# Patient Record
Sex: Female | Born: 1960
Health system: Southern US, Community
[De-identification: ages and names within clinical notes are randomized; demographics above are authoritative.]

## PROBLEM LIST (undated history)

## (undated) DIAGNOSIS — T7840XA Allergy, unspecified, initial encounter: Secondary | ICD-10-CM

## (undated) DIAGNOSIS — I10 Essential (primary) hypertension: Secondary | ICD-10-CM

## (undated) HISTORY — PX: BREAST BIOPSY: SHX20

## (undated) HISTORY — DX: Allergy, unspecified, initial encounter: T78.40XA

## (undated) HISTORY — PX: ENDOMETRIAL ABLATION: SHX621

## (undated) HISTORY — DX: Essential (primary) hypertension: I10

---

## 2005-04-05 ENCOUNTER — Encounter: Payer: Self-pay | Admitting: Obstetrics & Gynecology

## 2005-04-05 ENCOUNTER — Ambulatory Visit (HOSPITAL_COMMUNITY): Admission: RE | Admit: 2005-04-05 | Discharge: 2005-04-05 | Payer: Self-pay | Admitting: Obstetrics & Gynecology

## 2006-09-17 ENCOUNTER — Ambulatory Visit (HOSPITAL_COMMUNITY): Admission: RE | Admit: 2006-09-17 | Discharge: 2006-09-17 | Payer: Self-pay | Admitting: Obstetrics & Gynecology

## 2007-09-19 ENCOUNTER — Ambulatory Visit (HOSPITAL_COMMUNITY): Admission: RE | Admit: 2007-09-19 | Discharge: 2007-09-19 | Payer: Self-pay | Admitting: Obstetrics & Gynecology

## 2007-09-19 ENCOUNTER — Other Ambulatory Visit: Admission: RE | Admit: 2007-09-19 | Discharge: 2007-09-19 | Payer: Self-pay | Admitting: Obstetrics & Gynecology

## 2008-09-22 ENCOUNTER — Ambulatory Visit (HOSPITAL_COMMUNITY): Admission: RE | Admit: 2008-09-22 | Discharge: 2008-09-22 | Payer: Self-pay | Admitting: Obstetrics & Gynecology

## 2008-09-22 ENCOUNTER — Other Ambulatory Visit: Admission: RE | Admit: 2008-09-22 | Discharge: 2008-09-22 | Payer: Self-pay | Admitting: Obstetrics and Gynecology

## 2009-09-23 ENCOUNTER — Ambulatory Visit (HOSPITAL_COMMUNITY): Admission: RE | Admit: 2009-09-23 | Discharge: 2009-09-23 | Payer: Self-pay | Admitting: Obstetrics & Gynecology

## 2009-09-23 ENCOUNTER — Other Ambulatory Visit: Admission: RE | Admit: 2009-09-23 | Discharge: 2009-09-23 | Payer: Self-pay | Admitting: Obstetrics & Gynecology

## 2010-08-26 ENCOUNTER — Other Ambulatory Visit: Payer: Self-pay | Admitting: Obstetrics & Gynecology

## 2010-08-26 DIAGNOSIS — Z1239 Encounter for other screening for malignant neoplasm of breast: Secondary | ICD-10-CM

## 2010-09-27 ENCOUNTER — Other Ambulatory Visit (HOSPITAL_COMMUNITY)
Admission: RE | Admit: 2010-09-27 | Discharge: 2010-09-27 | Disposition: A | Discharge status: Home / Self Care | Payer: BC Managed Care – PPO | Source: Ambulatory Visit | Attending: Obstetrics & Gynecology | Admitting: Obstetrics & Gynecology

## 2010-09-27 ENCOUNTER — Ambulatory Visit (HOSPITAL_COMMUNITY)
Admission: RE | Admit: 2010-09-27 | Discharge: 2010-09-27 | Disposition: A | Discharge status: Home / Self Care | Payer: BC Managed Care – PPO | Source: Ambulatory Visit | Attending: Obstetrics & Gynecology | Admitting: Obstetrics & Gynecology

## 2010-09-27 ENCOUNTER — Other Ambulatory Visit: Payer: Self-pay | Admitting: Obstetrics & Gynecology

## 2010-09-27 DIAGNOSIS — Z1239 Encounter for other screening for malignant neoplasm of breast: Secondary | ICD-10-CM

## 2010-09-27 DIAGNOSIS — Z01419 Encounter for gynecological examination (general) (routine) without abnormal findings: Secondary | ICD-10-CM | POA: Insufficient documentation

## 2010-09-27 DIAGNOSIS — Z1231 Encounter for screening mammogram for malignant neoplasm of breast: Secondary | ICD-10-CM | POA: Insufficient documentation

## 2010-12-23 NOTE — Op Note (Signed)
Rebecca Williams, Rebecca Williams                 ACCOUNT NO.:  [id number]   MEDICAL RECORD NO.:  [id number]          PATIENT TYPE:  AMB   LOCATION:  DAY                           FACILITY:  [facility]   PHYSICIAN:  Lazaro Arms, M.D.   DATE OF BIRTH:  23-Mar-1961   DATE OF PROCEDURE:  04/05/2005  DATE OF DISCHARGE:                                 OPERATIVE REPORT   PREOPERATIVE DIAGNOSES:  1.  Menometrorrhagia.  2.  Dysmenorrhea.   POSTOPERATIVE DIAGNOSES:  1.  Menometrorrhagia.  2.  Dysmenorrhea.   PROCEDURE:  Hysteroscopy, dilatation and curettage, endometrial ablation.   SURGEON:  Lazaro Arms, M.D.   ANESTHESIA:  General endotracheal.   FINDINGS:  The patient; had a normal endometrial cavity.  There were no  polyps or fibroids seen.  It sounded 1 cm.   DESCRIPTION OF PROCEDURE:  The patient was taken to the operating room and  placed in the supine position where she underwent general endotracheal  anesthesia.  She was placed in the dorsal lithotomy position and prepped and  draped in the usual sterile fashion.  A Graves speculum was placed.  Her  cervix was grasped.  A paracervical block was placed using 0.5% Marcaine  with 1:200,000 epinephrine.  The uterus sounded to 8 cm.  The cervix dilated  serially to allow passage with the hysteroscope.  A hysteroscopy was  performed and no abnormalities seen.  A vigorous uterine curettage was  performed and a good uterine cry was obtained in all areas.  We then  performed the Gynecare Thermal Choice-III endometrial ablation that required  16 mL of D-5-W to maintain a pressure between 190 and 200 mmHg.  It was  heated up to 87 degrees Celsius, for a total therapy time of eight minutes  and 45 seconds.  All the fluid was recovered at the end of the procedure.   The patient was awakened from anesthesia.  She tolerated the procedure well.  She had minimal blood loss.  She received Ancef and Toradol  prophylactically.      Lazaro Arms, M.D.  Electronically Signed    LHE/MEDQ  D:  04/05/2005  T:  04/05/2005  Job:  [id number]

## 2010-12-23 NOTE — H&P (Signed)
NAME:  Rebecca Williams, Rebecca Williams             ACCOUNT NO.:  [id number]   MEDICAL RECORD NO.:  [id number]          PATIENT TYPE:  AMB   LOCATION:                                FACILITY:  [facility]   PHYSICIAN:  Lazaro Arms, M.D.   DATE OF BIRTH:  [birth year]   DATE OF ADMISSION:  DATE OF DISCHARGE:  LH                                HISTORY & PHYSICAL   HISTORY OF PRESENT ILLNESS:  Rebecca Williams is a 50 year old white female, gravida  1, para 1, status post a tubal ligation in the past, who has been suffering  with prolonged, frequent, heavy menstrual periods for the last year.  She is  also having some left lower quadrant pain, but generally hurts just when she  is bleeding.  She has only had pain with intercourse one time.  That is not  generally a problem.  A vaginal probe ultrasound was performed in the office  and revealed a normal endometrial stripe.  She had a couple of fundal  fibroids, a little irregular on the uterine surface, but certainly nothing  distorting the stripe.  Both ovaries were normal, just small follicular  cysts.  On bimanual her uterus feels normal size.  It was nontender with no  cervical motion tenderness.  As a result we discussed options and Rebecca Williams  wants to proceed with a hysteroscopy/D&C and endometrial ablation.   PAST MEDICAL HISTORY:  Negative.   PAST SURGICAL HISTORY:  She had a C-section in 2000.   PAST OB HISTORY:  C-section in 2000.   ALLERGIES:  None.   MEDICATIONS:  Allegra D and Prozac.   PERSONAL HISTORY:  Significant for migraines.   SOCIAL HISTORY:  She drinks wine on occasion.  No tobacco.  No substance  abuse.  She is a Training and development officer.  Her husband has had a vasectomy.   REVIEW OF SYSTEMS:  As per HPI, but otherwise negative.   PHYSICAL EXAMINATION:  VITAL SIGNS:  Her weight is 224 pounds, blood  pressure 106/76.  HEENT:  Unremarkable.  NECK:  Thyroid is normal.  LUNGS:  Clear.  HEART:  Regular rate and rhythm without murmur, regurge, or gallop.  BREASTS:  Without masses, discharge, or skin changes.  ABDOMEN:  Benign.  No hepatosplenomegaly or masses.  GENITALIA:  She has normal external genitalia.  Vagina is pink and moist  without discharge.  Cervix is parous without lesions.  Uterus is normal  size, shape, and contour.  Adnexa is negative.  EXTREMITIES:  No edema.  NEUROLOGIC:  Exam is grossly intact.   IMPRESSION:  1.  Menometrorrhagia and dysmenorrhea.  2.  Left lower quadrant pain while bleeding.   PLAN:  The patient is admitted for hysteroscopy/D&C/endometrial ablation.  She understands risks, benefits, indications, alternatives and will proceed.      Lazaro Arms, M.D.  Electronically Signed     LHE/MEDQ  D:  04/04/2005  T:  04/04/2005  Job:  [id number]

## 2011-08-16 ENCOUNTER — Other Ambulatory Visit: Payer: Self-pay | Admitting: Obstetrics & Gynecology

## 2011-08-16 DIAGNOSIS — Z139 Encounter for screening, unspecified: Secondary | ICD-10-CM

## 2011-10-02 ENCOUNTER — Ambulatory Visit (HOSPITAL_COMMUNITY)
Admission: RE | Admit: 2011-10-02 | Discharge: 2011-10-02 | Disposition: A | Discharge status: Home / Self Care | Payer: BC Managed Care – PPO | Source: Ambulatory Visit | Attending: Obstetrics & Gynecology | Admitting: Obstetrics & Gynecology

## 2011-10-02 DIAGNOSIS — Z1231 Encounter for screening mammogram for malignant neoplasm of breast: Secondary | ICD-10-CM | POA: Insufficient documentation

## 2011-10-02 DIAGNOSIS — Z139 Encounter for screening, unspecified: Secondary | ICD-10-CM

## 2011-10-06 ENCOUNTER — Other Ambulatory Visit: Payer: Self-pay | Admitting: Obstetrics & Gynecology

## 2011-10-06 DIAGNOSIS — R928 Other abnormal and inconclusive findings on diagnostic imaging of breast: Secondary | ICD-10-CM

## 2011-10-25 ENCOUNTER — Ambulatory Visit (HOSPITAL_COMMUNITY)
Admission: RE | Admit: 2011-10-25 | Discharge: 2011-10-25 | Disposition: A | Discharge status: Home / Self Care | Payer: BC Managed Care – PPO | Source: Ambulatory Visit | Attending: Obstetrics & Gynecology | Admitting: Obstetrics & Gynecology

## 2011-10-25 DIAGNOSIS — R928 Other abnormal and inconclusive findings on diagnostic imaging of breast: Secondary | ICD-10-CM | POA: Insufficient documentation

## 2011-10-27 ENCOUNTER — Other Ambulatory Visit (HOSPITAL_COMMUNITY)
Admission: RE | Admit: 2011-10-27 | Discharge: 2011-10-27 | Disposition: A | Discharge status: Home / Self Care | Payer: BC Managed Care – PPO | Source: Ambulatory Visit | Attending: Obstetrics & Gynecology | Admitting: Obstetrics & Gynecology

## 2011-10-27 ENCOUNTER — Other Ambulatory Visit: Payer: Self-pay | Admitting: Obstetrics & Gynecology

## 2011-10-27 DIAGNOSIS — Z01419 Encounter for gynecological examination (general) (routine) without abnormal findings: Secondary | ICD-10-CM | POA: Insufficient documentation

## 2012-09-17 ENCOUNTER — Other Ambulatory Visit (HOSPITAL_COMMUNITY): Payer: Self-pay | Admitting: Obstetrics & Gynecology

## 2012-09-17 DIAGNOSIS — Z139 Encounter for screening, unspecified: Secondary | ICD-10-CM

## 2012-10-28 ENCOUNTER — Encounter: Payer: Self-pay | Admitting: Obstetrics & Gynecology

## 2012-10-28 ENCOUNTER — Ambulatory Visit (HOSPITAL_COMMUNITY)
Admission: RE | Admit: 2012-10-28 | Discharge: 2012-10-28 | Disposition: A | Discharge status: Home / Self Care | Payer: BC Managed Care – PPO | Source: Ambulatory Visit | Attending: Obstetrics & Gynecology | Admitting: Obstetrics & Gynecology

## 2012-10-28 ENCOUNTER — Ambulatory Visit (INDEPENDENT_AMBULATORY_CARE_PROVIDER_SITE_OTHER): Payer: BC Managed Care – PPO | Admitting: Obstetrics & Gynecology

## 2012-10-28 VITALS — BP 148/92 | Ht 67.0 in | Wt 205.0 lb

## 2012-10-28 DIAGNOSIS — Z139 Encounter for screening, unspecified: Secondary | ICD-10-CM

## 2012-10-28 DIAGNOSIS — Z0000 Encounter for general adult medical examination without abnormal findings: Secondary | ICD-10-CM

## 2012-10-28 DIAGNOSIS — Z1231 Encounter for screening mammogram for malignant neoplasm of breast: Secondary | ICD-10-CM | POA: Insufficient documentation

## 2012-10-28 DIAGNOSIS — Z01419 Encounter for gynecological examination (general) (routine) without abnormal findings: Secondary | ICD-10-CM

## 2012-10-28 MED ORDER — CONJ ESTROG-MEDROXYPROGEST ACE 0.625-2.5 MG PO TABS: 1.0000 | ORAL_TABLET | Freq: Every day | ORAL | Status: DC

## 2012-10-28 NOTE — Patient Instructions (Signed)

## 2012-10-28 NOTE — Progress Notes (Signed)
Patient ID: Rebecca Williams, female   DOB: Oct 04, 1960, 52 y.o.   MRN: [id number] Routine Gyn Exam Patient here for routine exam. Current complaints: None. Personal health questionnaire reviewed: yes   Gynecologic History No LMP recorded. Patient is postmenopausal. Contraception: post menopausal status Last Pap: 3.2013. Results were: normal Last mammogram: 2013. Results were: normal  Obstetric History OB History   Grav Para Term Preterm Abortions TAB SAB Ect Mult Living   1 1             # Outc Date GA Lbr Len/2nd Wgt Sex Del Anes PTL Lv   1 PAR               Subjective:     Obstetric History OB History   Grav Para Term Preterm Abortions TAB SAB Ect Mult Living   1 1             # Outc Date GA Lbr Len/2nd Wgt Sex Del Anes PTL Lv   1 PAR              Current outpatient prescriptions:estrogen, conjugated,-medroxyprogesterone (PREMPRO) 0.625-2.5 MG per tablet, Take 1 tablet by mouth daily., Disp: , Rfl: ;  fexofenadine-pseudoephedrine (ALLEGRA-D) 60-120 MG per tablet, Take 1 tablet by mouth 2 (two) times daily., Disp: , Rfl: ;  hydrochlorothiazide (MICROZIDE) 12.5 MG capsule, Take 12.5 mg by mouth daily., Disp: , Rfl:  estrogen, conjugated,-medroxyprogesterone (PREMPRO) 0.625-2.5 MG per tablet, Take 1 tablet by mouth daily., Disp: 30 tablet, Rfl: 11   The following portions of the patient's history were reviewed and updated as appropriate: allergies, current medications, past family history, past medical history, past social history, past surgical history and problem list.  Review of Systems  Review of Systems  Constitutional: Negative for fever, chills, weight loss, malaise/fatigue and diaphoresis.  HENT: Negative for hearing loss, ear pain, nosebleeds, congestion, sore throat, neck pain, tinnitus and ear discharge.   Eyes: Negative for blurred vision, double vision, photophobia, pain, discharge and redness.  Respiratory: Negative for cough, hemoptysis, sputum production,  shortness of breath, wheezing and stridor.   Cardiovascular: Negative for chest pain, palpitations, orthopnea, claudication, leg swelling and PND.  Gastrointestinal: Negative for abdominal pain. Negative for heartburn, nausea, vomiting, diarrhea, constipation, blood in stool and melena.  Genitourinary: Negative for dysuria, urgency, frequency, hematuria and flank pain.  Musculoskeletal: Negative for myalgias, back pain, joint pain and falls.  Skin: Negative for itching and rash.  Neurological: Negative for dizziness, tingling, tremors, sensory change, speech change, focal weakness, seizures, loss of consciousness, weakness and headaches.  Endo/Heme/Allergies: Negative for environmental allergies and polydipsia. Does not bruise/bleed easily.  Psychiatric/Behavioral: Negative for depression, suicidal ideas, hallucinations, memory loss and substance abuse. The patient is not nervous/anxious and does not have insomnia.         Objective:    BP 148/92  Ht 5\' 7"  (1.702 m)  Wt 205 lb (92.987 kg)  BMI 32.1 kg/m2  General Appearance:    Alert, cooperative, no distress, appears stated age  Head:    Normocephalic, without obvious abnormality, atraumatic  Eyes:    PERRL, conjunctiva/corneas clear, EOM's intact, fundi    benign, both eyes  Ears:    Normal TM's and external ear canals, both ears  Nose:   Nares normal, septum midline, mucosa normal, no drainage    or sinus tenderness  Throat:   Lips, mucosa, and tongue normal; teeth and gums normal  Neck:   Supple, symmetrical, trachea midline, no adenopathy;  thyroid:  no enlargement/tenderness/nodules; no carotid   bruit or JVD  Back:     Symmetric, no curvature, ROM normal, no CVA tenderness  Lungs:     Clear to auscultation bilaterally, respirations unlabored  Chest Wall:    No tenderness or deformity   Heart:    Regular rate and rhythm, S1 and S2 normal, no murmur, rub   or gallop  Breast Exam:    No tenderness, masses, or nipple abnormality   Abdomen:     Soft, non-tender, bowel sounds active all four quadrants,    no masses, no organomegaly  Genitalia:    Normal female without lesion, discharge or tenderness  Rectal:    Normal tone, normal prostate, no masses or tenderness;   guaiac negative stool  Extremities:   Extremities normal, atraumatic, no cyanosis or edema  Pulses:   2+ and symmetric all extremities  Skin:   Skin color, texture, turgor normal, no rashes or lesions  Lymph nodes:   Cervical, supraclavicular, and axillary nodes normal  Neurologic:   CNII-XII intact, normal strength, sensation and reflexes    throughout     Pap obtained Assessment:    Healthy female exam.  Menopausal   Plan:    Hormone replacement therapy: hormone replacement therapy: Continue PremPro + Elestrin 2 pumps for menopausal symptoms. Mammogram ordered. Follow up in: 1 year.   Lequisha Cammack H 10/28/2012 11:50 AM

## 2012-12-31 ENCOUNTER — Other Ambulatory Visit: Payer: Self-pay | Admitting: Obstetrics & Gynecology

## 2013-09-05 ENCOUNTER — Other Ambulatory Visit: Payer: Self-pay | Admitting: Obstetrics & Gynecology

## 2013-09-05 DIAGNOSIS — Z139 Encounter for screening, unspecified: Secondary | ICD-10-CM

## 2013-10-30 ENCOUNTER — Encounter: Payer: Self-pay | Admitting: Obstetrics & Gynecology

## 2013-10-30 ENCOUNTER — Encounter (INDEPENDENT_AMBULATORY_CARE_PROVIDER_SITE_OTHER): Payer: Self-pay

## 2013-10-30 ENCOUNTER — Ambulatory Visit (INDEPENDENT_AMBULATORY_CARE_PROVIDER_SITE_OTHER): Payer: BC Managed Care – PPO | Admitting: Obstetrics & Gynecology

## 2013-10-30 ENCOUNTER — Other Ambulatory Visit (HOSPITAL_COMMUNITY)
Admission: RE | Admit: 2013-10-30 | Discharge: 2013-10-30 | Disposition: A | Discharge status: Home / Self Care | Payer: BC Managed Care – PPO | Source: Ambulatory Visit | Attending: Obstetrics & Gynecology | Admitting: Obstetrics & Gynecology

## 2013-10-30 ENCOUNTER — Ambulatory Visit (HOSPITAL_COMMUNITY)
Admission: RE | Admit: 2013-10-30 | Discharge: 2013-10-30 | Disposition: A | Discharge status: Home / Self Care | Payer: BC Managed Care – PPO | Source: Ambulatory Visit | Attending: Obstetrics & Gynecology | Admitting: Obstetrics & Gynecology

## 2013-10-30 VITALS — BP 120/80 | Ht 67.4 in | Wt 202.0 lb

## 2013-10-30 DIAGNOSIS — Z1151 Encounter for screening for human papillomavirus (HPV): Secondary | ICD-10-CM | POA: Insufficient documentation

## 2013-10-30 DIAGNOSIS — Z139 Encounter for screening, unspecified: Secondary | ICD-10-CM

## 2013-10-30 DIAGNOSIS — Z1231 Encounter for screening mammogram for malignant neoplasm of breast: Secondary | ICD-10-CM | POA: Insufficient documentation

## 2013-10-30 DIAGNOSIS — Z1212 Encounter for screening for malignant neoplasm of rectum: Secondary | ICD-10-CM

## 2013-10-30 DIAGNOSIS — Z124 Encounter for screening for malignant neoplasm of cervix: Secondary | ICD-10-CM | POA: Insufficient documentation

## 2013-10-30 DIAGNOSIS — Z01419 Encounter for gynecological examination (general) (routine) without abnormal findings: Secondary | ICD-10-CM

## 2013-10-30 MED ORDER — ESTRADIOL 0.52 MG/0.87 GM (0.06%) TD GEL: 1.0000 "application " | Freq: Every day | TRANSDERMAL | Status: DC

## 2013-10-30 MED ORDER — CONJ ESTROGENS-BAZEDOXIFENE 0.45-20 MG PO TABS: 1.0000 | ORAL_TABLET | Freq: Every day | ORAL | Status: DC

## 2013-10-30 NOTE — Progress Notes (Signed)
Patient ID: Rebecca Williams, female   DOB: [birth year], 53 y.o.   MRN: [id number] Subjective:     Rebecca Williams is a 53 y.o. female here for a routine exam.  No LMP recorded. Patient has had an ablation. G1P1 Birth Control Method:  Pm  Menstrual Calendar(currently): none  Current complaints: none.   Current acute medical issues:  none   Recent Gynecologic History No LMP recorded. Patient has had an ablation. Last Pap: 2014,  normal Last mammogram: 2014,  normal  Past Medical History  Diagnosis Date  . Allergy   . Hypertension     Past Surgical History  Procedure Laterality Date  . Endometrial ablation    . Cesarean section      OB History   Grav Para Term Preterm Abortions TAB SAB Ect Mult Living   1 1              History   Social History  . Marital Status: Married    Spouse Name: N/A    Number of Children: N/A  . Years of Education: N/A   Social History Main Topics  . Smoking status: Never Smoker   . Smokeless tobacco: None  . Alcohol Use: No  . Drug Use: No  . Sexual Activity: Yes    Birth Control/ Protection: Post-menopausal   Other Topics Concern  . None   Social History Narrative  . None    Family History  Problem Relation Age of Onset  . Diabetes Father   . Hypertension Father   . Hyperlipidemia Father   . Diabetes Mother   . Hypertension Mother   . Hyperlipidemia Mother      Review of Systems  Review of Systems  Constitutional: Negative for fever, chills, weight loss, malaise/fatigue and diaphoresis.  HENT: Negative for hearing loss, ear pain, nosebleeds, congestion, sore throat, neck pain, tinnitus and ear discharge.   Eyes: Negative for blurred vision, double vision, photophobia, pain, discharge and redness.  Respiratory: Negative for cough, hemoptysis, sputum production, shortness of breath, wheezing and stridor.   Cardiovascular: Negative for chest pain, palpitations, orthopnea, claudication, leg swelling and PND.   Gastrointestinal: negative for abdominal pain. Negative for heartburn, nausea, vomiting, diarrhea, constipation, blood in stool and melena.  Genitourinary: Negative for dysuria, urgency, frequency, hematuria and flank pain.  Musculoskeletal: Negative for myalgias, back pain, joint pain and falls.  Skin: Negative for itching and rash.  Neurological: Negative for dizziness, tingling, tremors, sensory change, speech change, focal weakness, seizures, loss of consciousness, weakness and headaches.  Endo/Heme/Allergies: Negative for environmental allergies and polydipsia. Does not bruise/bleed easily.  Psychiatric/Behavioral: Negative for depression, suicidal ideas, hallucinations, memory loss and substance abuse. The patient is not nervous/anxious and does not have insomnia.        Objective:    Physical Exam  Vitals reviewed. Constitutional: She is oriented to person, place, and time. She appears well-developed and well-nourished.  HENT:  Head: Normocephalic and atraumatic.        Right Ear: External ear normal.  Left Ear: External ear normal.  Nose: Nose normal.  Mouth/Throat: Oropharynx is clear and moist.  Eyes: Conjunctivae and EOM are normal. Pupils are equal, round, and reactive to light. Right eye exhibits no discharge. Left eye exhibits no discharge. No scleral icterus.  Neck: Normal range of motion. Neck supple. No tracheal deviation present. No thyromegaly present.  Cardiovascular: Normal rate, regular rhythm, normal heart sounds and intact distal pulses.  Exam reveals no gallop and  no friction rub.   No murmur heard. Respiratory: Effort normal and breath sounds normal. No respiratory distress. She has no wheezes. She has no rales. She exhibits no tenderness.  GI: Soft. Bowel sounds are normal. She exhibits no distension and no mass. There is no tenderness. There is no rebound and no guarding.  Genitourinary:  Breasts no masses skin changes or nipple changes bilaterally       Vulva is normal without lesions Vagina is pink moist without discharge Cervix normal in appearance and pap is done Uterus is normal size shape and contour Adnexa is negative with normal sized ovaries  Rectal    hemoccult negative, normal tone, no masses  Musculoskeletal: Normal range of motion. She exhibits no edema and no tenderness.  Neurological: She is alert and oriented to person, place, and time. She has normal reflexes. She displays normal reflexes. No cranial nerve deficit. She exhibits normal muscle tone. Coordination normal.  Skin: Skin is warm and dry. No rash noted. No erythema. No pallor.  Psychiatric: She has a normal mood and affect. Her behavior is normal. Judgment and thought content normal.       Assessment:    Healthy female exam.    Plan:    Hormone replacement therapy: hormone replacement therapy: Duavee. Follow up in: 1 year.

## 2013-11-10 ENCOUNTER — Other Ambulatory Visit: Payer: Self-pay | Admitting: Obstetrics & Gynecology

## 2013-11-10 DIAGNOSIS — R928 Other abnormal and inconclusive findings on diagnostic imaging of breast: Secondary | ICD-10-CM

## 2013-11-19 ENCOUNTER — Ambulatory Visit (HOSPITAL_COMMUNITY)
Admission: RE | Admit: 2013-11-19 | Discharge: 2013-11-19 | Disposition: A | Discharge status: Home / Self Care | Payer: BC Managed Care – PPO | Source: Ambulatory Visit | Attending: Obstetrics & Gynecology | Admitting: Obstetrics & Gynecology

## 2013-11-19 DIAGNOSIS — R928 Other abnormal and inconclusive findings on diagnostic imaging of breast: Secondary | ICD-10-CM | POA: Insufficient documentation

## 2014-06-08 ENCOUNTER — Encounter: Payer: Self-pay | Admitting: Obstetrics & Gynecology

## 2014-10-28 ENCOUNTER — Other Ambulatory Visit: Payer: Self-pay | Admitting: Obstetrics & Gynecology

## 2014-10-28 DIAGNOSIS — Z1231 Encounter for screening mammogram for malignant neoplasm of breast: Secondary | ICD-10-CM

## 2014-11-05 ENCOUNTER — Other Ambulatory Visit: Payer: Self-pay | Admitting: Obstetrics & Gynecology

## 2014-11-16 ENCOUNTER — Encounter: Payer: Self-pay | Admitting: Obstetrics & Gynecology

## 2014-11-16 ENCOUNTER — Ambulatory Visit (INDEPENDENT_AMBULATORY_CARE_PROVIDER_SITE_OTHER): Payer: BLUE CROSS/BLUE SHIELD | Admitting: Obstetrics & Gynecology

## 2014-11-16 ENCOUNTER — Other Ambulatory Visit: Payer: Self-pay | Admitting: Obstetrics & Gynecology

## 2014-11-16 ENCOUNTER — Other Ambulatory Visit (HOSPITAL_COMMUNITY)
Admission: RE | Admit: 2014-11-16 | Discharge: 2014-11-16 | Disposition: A | Discharge status: Home / Self Care | Payer: BLUE CROSS/BLUE SHIELD | Source: Ambulatory Visit | Attending: Obstetrics & Gynecology | Admitting: Obstetrics & Gynecology

## 2014-11-16 VITALS — BP 140/90 | HR 72 | Ht 68.0 in | Wt 195.0 lb

## 2014-11-16 DIAGNOSIS — E038 Other specified hypothyroidism: Secondary | ICD-10-CM

## 2014-11-16 DIAGNOSIS — Z01419 Encounter for gynecological examination (general) (routine) without abnormal findings: Secondary | ICD-10-CM

## 2014-11-16 DIAGNOSIS — R7989 Other specified abnormal findings of blood chemistry: Secondary | ICD-10-CM

## 2014-11-16 DIAGNOSIS — Z1211 Encounter for screening for malignant neoplasm of colon: Secondary | ICD-10-CM

## 2014-11-16 DIAGNOSIS — Z1212 Encounter for screening for malignant neoplasm of rectum: Secondary | ICD-10-CM

## 2014-11-16 DIAGNOSIS — Z1389 Encounter for screening for other disorder: Secondary | ICD-10-CM

## 2014-11-16 DIAGNOSIS — E7800 Pure hypercholesterolemia, unspecified: Secondary | ICD-10-CM

## 2014-11-16 DIAGNOSIS — R739 Hyperglycemia, unspecified: Secondary | ICD-10-CM

## 2014-11-16 MED ORDER — ESTRADIOL 0.52 MG/0.87 GM (0.06%) TD GEL: TRANSDERMAL | Status: DC

## 2014-11-16 MED ORDER — CONJ ESTROGENS-BAZEDOXIFENE 0.45-20 MG PO TABS: 1.0000 | ORAL_TABLET | Freq: Every day | ORAL | Status: DC

## 2014-11-16 NOTE — Progress Notes (Signed)
Patient ID: Rebecca Williams, female   DOB: 09-11-1960, 54 y.o.   MRN: [id number] Subjective:     Rebecca Williams is a 54 y.o. female here for a routine exam.  No LMP recorded. Patient is postmenopausal. G1P1 Birth Control Method:  Post menopausal Menstrual Calendar(currently): no bleeding  Current complaints: none.   Current acute medical issues:  none   Recent Gynecologic History No LMP recorded. Patient is postmenopausal. Last Pap: 2015,  normal Last mammogram: 2015,  normal  Past Medical History  Diagnosis Date  . Allergy   . Hypertension     Past Surgical History  Procedure Laterality Date  . Endometrial ablation    . Cesarean section      OB History    Gravida Para Term Preterm AB TAB SAB Ectopic Multiple Living   1 1              History   Social History  . Marital Status: Married    Spouse Name: N/A  . Number of Children: N/A  . Years of Education: N/A   Social History Main Topics  . Smoking status: Never Smoker   . Smokeless tobacco: Not on file  . Alcohol Use: No  . Drug Use: No  . Sexual Activity: Yes    Birth Control/ Protection: Post-menopausal   Other Topics Concern  . None   Social History Narrative    Family History  Problem Relation Age of Onset  . Diabetes Father   . Hypertension Father   . Hyperlipidemia Father   . Diabetes Mother   . Hypertension Mother   . Hyperlipidemia Mother      Current outpatient prescriptions:  .  Ascorbic Acid (VITAMIN C) 100 MG tablet, Take 100 mg by mouth daily., Disp: , Rfl:  .  BLACK COHOSH PO, Take by mouth., Disp: , Rfl:  .  cyanocobalamin 100 MCG tablet, Take 100 mcg by mouth daily., Disp: , Rfl:  .  ELESTRIN 0.52 MG/0.87 GM (0.06%) GEL, USE 1 PUMP DAILY, INCREASE IF NEEDED, Disp: 52 g, Rfl: 11 .  Estradiol 0.52 MG/0.87 GM (0.06%) GEL, Apply 1 application topically daily., Disp: 1 Bottle, Rfl: 11 .  fexofenadine-pseudoephedrine (ALLEGRA-D) 60-120 MG per tablet, Take 1 tablet by mouth 2 (two)  times daily., Disp: , Rfl:  .  hydrochlorothiazide (MICROZIDE) 12.5 MG capsule, Take 12.5 mg by mouth daily., Disp: , Rfl:  .  zinc gluconate 50 MG tablet, Take 50 mg by mouth daily., Disp: , Rfl:  .  Conj Estrogens-Bazedoxifene 0.45-20 MG TABS, Take 1 tablet by mouth daily. Take 1 tablet daily (Patient not taking: Reported on 11/16/2014), Disp: 30 tablet, Rfl: 11  Review of Systems  Review of Systems  Constitutional: Negative for fever, chills, weight loss, malaise/fatigue and diaphoresis.  HENT: Negative for hearing loss, ear pain, nosebleeds, congestion, sore throat, neck pain, tinnitus and ear discharge.   Eyes: Negative for blurred vision, double vision, photophobia, pain, discharge and redness.  Respiratory: Negative for cough, hemoptysis, sputum production, shortness of breath, wheezing and stridor.   Cardiovascular: Negative for chest pain, palpitations, orthopnea, claudication, leg swelling and PND.  Gastrointestinal: negative for abdominal pain. Negative for heartburn, nausea, vomiting, diarrhea, constipation, blood in stool and melena.  Genitourinary: Negative for dysuria, urgency, frequency, hematuria and flank pain.  Musculoskeletal: Negative for myalgias, back pain, joint pain and falls.  Skin: Negative for itching and rash.  Neurological: Negative for dizziness, tingling, tremors, sensory change, speech change, focal weakness, seizures,  loss of consciousness, weakness and headaches.  Endo/Heme/Allergies: Negative for environmental allergies and polydipsia. Does not bruise/bleed easily.  Psychiatric/Behavioral: Negative for depression, suicidal ideas, hallucinations, memory loss and substance abuse. The patient is not nervous/anxious and does not have insomnia.        Objective:  Blood pressure 140/90, pulse 72, height 5\' 8"  (1.727 m), weight 195 lb (88.451 kg).   Physical Exam  Vitals reviewed. Constitutional: She is oriented to person, place, and time. She appears  well-developed and well-nourished.  HENT:  Head: Normocephalic and atraumatic.        Right Ear: External ear normal.  Left Ear: External ear normal.  Nose: Nose normal.  Mouth/Throat: Oropharynx is clear and moist.  Eyes: Conjunctivae and EOM are normal. Pupils are equal, round, and reactive to light. Right eye exhibits no discharge. Left eye exhibits no discharge. No scleral icterus.  Neck: Normal range of motion. Neck supple. No tracheal deviation present. No thyromegaly present.  Cardiovascular: Normal rate, regular rhythm, normal heart sounds and intact distal pulses.  Exam reveals no gallop and no friction rub.   No murmur heard. Respiratory: Effort normal and breath sounds normal. No respiratory distress. She has no wheezes. She has no rales. She exhibits no tenderness.  GI: Soft. Bowel sounds are normal. She exhibits no distension and no mass. There is no tenderness. There is no rebound and no guarding.  Genitourinary:  Breasts no masses skin changes or nipple changes bilaterally      Vulva is normal without lesions Vagina is pink moist without discharge Cervix normal in appearance and pap is done Uterus is normal size shape and contour Adnexa is negative with normal sized ovaries  {Rectal    hemoccult negative, normal tone, no masses  Musculoskeletal: Normal range of motion. She exhibits no edema and no tenderness.  Neurological: She is alert and oriented to person, place, and time. She has normal reflexes. She displays normal reflexes. No cranial nerve deficit. She exhibits normal muscle tone. Coordination normal.  Skin: Skin is warm and dry. No rash noted. No erythema. No pallor.  Psychiatric: She has a normal mood and affect. Her behavior is normal. Judgment and thought content normal.       Assessment:    Healthy female exam.    Plan:    Hormone replacement therapy: hormone replacement therapy: Elestrin + duavee. Mammogram ordered. Follow up in: 1 year.   TSH VIT  D Lipid panel Hgb A1C

## 2014-11-17 LAB — CYTOLOGY - PAP

## 2014-11-19 ENCOUNTER — Telehealth: Payer: Self-pay | Admitting: Obstetrics & Gynecology

## 2014-11-19 NOTE — Telephone Encounter (Signed)
Pt informed of lab results from 11/15/2013, Pap WNL, Cholesterol 218, Vit D pending. Pt verbalized understanding.

## 2014-11-22 LAB — VITAMIN D 1,25 DIHYDROXY
VITAMIN D3 1, 25 (OH): 43 pg/mL
Vitamin D 1, 25 (OH)2 Total: 44 pg/mL
Vitamin D2 1, 25 (OH)2: <10 pg/mL

## 2014-11-22 LAB — HEMOGLOBIN A1C
ESTIMATED AVERAGE GLUCOSE: 111 mg/dL
HEMOGLOBIN A1C: 5.5 % (ref 4.8–5.6)

## 2014-11-22 LAB — LIPID PANEL
CHOL/HDL RATIO: 2.1 ratio (ref 0.0–4.4)
Cholesterol, Total: 218 mg/dL — ABNORMAL HIGH (ref 100–199)
HDL: 103 mg/dL (ref >39)
LDL Calculated: 100 mg/dL — ABNORMAL HIGH (ref 0–99)
TRIGLYCERIDES: 74 mg/dL (ref 0–149)
VLDL Cholesterol Cal: 15 mg/dL (ref 5–40)

## 2014-11-22 LAB — TSH: TSH: 1.5 u[IU]/mL (ref 0.450–4.500)

## 2014-12-17 ENCOUNTER — Ambulatory Visit (HOSPITAL_COMMUNITY)
Admission: RE | Admit: 2014-12-17 | Discharge: 2014-12-17 | Disposition: A | Discharge status: Home / Self Care | Payer: BLUE CROSS/BLUE SHIELD | Source: Ambulatory Visit | Attending: Obstetrics & Gynecology | Admitting: Obstetrics & Gynecology

## 2014-12-17 DIAGNOSIS — Z1231 Encounter for screening mammogram for malignant neoplasm of breast: Secondary | ICD-10-CM | POA: Insufficient documentation

## 2015-11-15 ENCOUNTER — Other Ambulatory Visit: Payer: Self-pay | Admitting: Obstetrics & Gynecology

## 2015-11-15 DIAGNOSIS — Z1231 Encounter for screening mammogram for malignant neoplasm of breast: Secondary | ICD-10-CM

## 2015-11-23 ENCOUNTER — Other Ambulatory Visit: Payer: Self-pay | Admitting: Obstetrics & Gynecology

## 2015-12-21 ENCOUNTER — Other Ambulatory Visit: Payer: Self-pay | Admitting: Obstetrics & Gynecology

## 2015-12-21 ENCOUNTER — Other Ambulatory Visit: Payer: BLUE CROSS/BLUE SHIELD | Admitting: Obstetrics & Gynecology

## 2015-12-22 ENCOUNTER — Ambulatory Visit (HOSPITAL_COMMUNITY): Payer: BLUE CROSS/BLUE SHIELD

## 2015-12-23 ENCOUNTER — Encounter: Payer: Self-pay | Admitting: Obstetrics & Gynecology

## 2015-12-23 ENCOUNTER — Ambulatory Visit (INDEPENDENT_AMBULATORY_CARE_PROVIDER_SITE_OTHER): Payer: BLUE CROSS/BLUE SHIELD | Admitting: Obstetrics & Gynecology

## 2015-12-23 ENCOUNTER — Ambulatory Visit (HOSPITAL_COMMUNITY)
Admission: RE | Admit: 2015-12-23 | Discharge: 2015-12-23 | Disposition: A | Discharge status: Home / Self Care | Payer: BLUE CROSS/BLUE SHIELD | Source: Ambulatory Visit | Attending: Obstetrics & Gynecology | Admitting: Obstetrics & Gynecology

## 2015-12-23 ENCOUNTER — Other Ambulatory Visit (HOSPITAL_COMMUNITY)
Admission: RE | Admit: 2015-12-23 | Discharge: 2015-12-23 | Disposition: A | Discharge status: Home / Self Care | Payer: BLUE CROSS/BLUE SHIELD | Source: Ambulatory Visit | Attending: Obstetrics & Gynecology | Admitting: Obstetrics & Gynecology

## 2015-12-23 VITALS — BP 120/90 | HR 76 | Ht 68.0 in | Wt 180.0 lb

## 2015-12-23 DIAGNOSIS — Z1231 Encounter for screening mammogram for malignant neoplasm of breast: Secondary | ICD-10-CM | POA: Insufficient documentation

## 2015-12-23 DIAGNOSIS — Z1212 Encounter for screening for malignant neoplasm of rectum: Secondary | ICD-10-CM | POA: Diagnosis not present

## 2015-12-23 DIAGNOSIS — Z01419 Encounter for gynecological examination (general) (routine) without abnormal findings: Secondary | ICD-10-CM | POA: Diagnosis present

## 2015-12-23 DIAGNOSIS — Z1211 Encounter for screening for malignant neoplasm of colon: Secondary | ICD-10-CM

## 2015-12-23 NOTE — Progress Notes (Signed)
Patient ID: Rebecca Williams, female   DOB: January 17, 1961, 56 y.o.   MRN: [id number] Subjective:     Rebecca Williams is a 55 y.o. female here for a routine exam.  No LMP recorded. Patient has had an ablation. G1P1 Birth Control Method:  BTL Menstrual Calendar(currently): endometrial ablation 2006  Current complaints: none.   Current acute medical issues:  none   Recent Gynecologic History No LMP recorded. Patient has had an ablation. Last Pap: 2016,  normal Last mammogram: today,  pending  Past Medical History  Diagnosis Date  . Allergy   . Hypertension     Past Surgical History  Procedure Laterality Date  . Endometrial ablation    . Cesarean section      OB History    Gravida Para Term Preterm AB TAB SAB Ectopic Multiple Living   1 1              Social History   Social History  . Marital Status: Married    Spouse Name: N/A  . Number of Children: N/A  . Years of Education: N/A   Social History Main Topics  . Smoking status: Never Smoker   . Smokeless tobacco: None  . Alcohol Use: No  . Drug Use: No  . Sexual Activity: Yes    Birth Control/ Protection: Post-menopausal   Other Topics Concern  . None   Social History Narrative    Family History  Problem Relation Age of Onset  . Diabetes Father   . Hypertension Father   . Hyperlipidemia Father   . Diabetes Mother   . Hypertension Mother   . Hyperlipidemia Mother      Current outpatient prescriptions:  .  Ascorbic Acid (VITAMIN C) 100 MG tablet, Take 100 mg by mouth daily., Disp: , Rfl:  .  Biotin 5000 MCG TABS, Take by mouth., Disp: , Rfl:  .  BLACK COHOSH PO, Take by mouth., Disp: , Rfl:  .  cyanocobalamin 100 MCG tablet, Take 100 mcg by mouth daily., Disp: , Rfl:  .  DUAVEE 0.45-20 MG TABS, TAKE ONE TABLET BY MOUTH DAILY, Disp: 30 tablet, Rfl: 11 .  ELESTRIN 0.52 MG/0.87 GM (0.06%) GEL, USE 1 PUMP DAILY, INCREASE IF NEEDED, Disp: 52 g, Rfl: 11 .  fexofenadine-pseudoephedrine (ALLEGRA-D) 60-120 MG  per tablet, Take 1 tablet by mouth 2 (two) times daily., Disp: , Rfl:  .  hydrochlorothiazide (MICROZIDE) 12.5 MG capsule, Take 12.5 mg by mouth daily., Disp: , Rfl:  .  zinc gluconate 50 MG tablet, Take 50 mg by mouth daily., Disp: , Rfl:   Review of Systems  Review of Systems  Constitutional: Negative for fever, chills, weight loss, malaise/fatigue and diaphoresis.  HENT: Negative for hearing loss, ear pain, nosebleeds, congestion, sore throat, neck pain, tinnitus and ear discharge.   Eyes: Negative for blurred vision, double vision, photophobia, pain, discharge and redness.  Respiratory: Negative for cough, hemoptysis, sputum production, shortness of breath, wheezing and stridor.   Cardiovascular: Negative for chest pain, palpitations, orthopnea, claudication, leg swelling and PND.  Gastrointestinal: negative for abdominal pain. Negative for heartburn, nausea, vomiting, diarrhea, constipation, blood in stool and melena.  Genitourinary: Negative for dysuria, urgency, frequency, hematuria and flank pain.  Musculoskeletal: Negative for myalgias, back pain, joint pain and falls.  Skin: Negative for itching and rash.  Neurological: Negative for dizziness, tingling, tremors, sensory change, speech change, focal weakness, seizures, loss of consciousness, weakness and headaches.  Endo/Heme/Allergies: Negative for environmental allergies and  polydipsia. Does not bruise/bleed easily.  Psychiatric/Behavioral: Negative for depression, suicidal ideas, hallucinations, memory loss and substance abuse. The patient is not nervous/anxious and does not have insomnia.        Objective:  Blood pressure 120/90, pulse 76, height 5\' 8"  (1.727 m), weight 180 lb (81.647 kg).   Physical Exam  Vitals reviewed. Constitutional: She is oriented to person, place, and time. She appears well-developed and well-nourished.  HENT:  Head: Normocephalic and atraumatic.        Right Ear: External ear normal.  Left Ear:  External ear normal.  Nose: Nose normal.  Mouth/Throat: Oropharynx is clear and moist.  Eyes: Conjunctivae and EOM are normal. Pupils are equal, round, and reactive to light. Right eye exhibits no discharge. Left eye exhibits no discharge. No scleral icterus.  Neck: Normal range of motion. Neck supple. No tracheal deviation present. No thyromegaly present.  Cardiovascular: Normal rate, regular rhythm, normal heart sounds and intact distal pulses.  Exam reveals no gallop and no friction rub.   No murmur heard. Respiratory: Effort normal and breath sounds normal. No respiratory distress. She has no wheezes. She has no rales. She exhibits no tenderness.  GI: Soft. Bowel sounds are normal. She exhibits no distension and no mass. There is no tenderness. There is no rebound and no guarding.  Genitourinary:  Breasts no masses skin changes or nipple changes bilaterally      Vulva is normal without lesions Vagina is pink moist without discharge Cervix normal in appearance and pap is done Uterus is normal size shape and contour Adnexa is negative with normal sized ovaries  {Rectal    hemoccult negative, normal tone, no masses  Musculoskeletal: Normal range of motion. She exhibits no edema and no tenderness.  Neurological: She is alert and oriented to person, place, and time. She has normal reflexes. She displays normal reflexes. No cranial nerve deficit. She exhibits normal muscle tone. Coordination normal.  Skin: Skin is warm and dry. No rash noted. No erythema. No pallor.  Psychiatric: She has a normal mood and affect. Her behavior is normal. Judgment and thought content normal.       Medications Ordered at today's visit: Meds ordered this encounter  Medications  . Biotin 5000 MCG TABS    Sig: Take by mouth.    Other orders placed at today's visit: No orders of the defined types were placed in this encounter.      Assessment:    Healthy female exam.    Plan:    Mammogram  ordered. Follow up in: 1 year.     Return in about 1 year (around 12/22/2016) for yearly, with Dr Elonda Husky.

## 2015-12-27 ENCOUNTER — Other Ambulatory Visit: Payer: Self-pay | Admitting: Obstetrics & Gynecology

## 2015-12-27 DIAGNOSIS — R928 Other abnormal and inconclusive findings on diagnostic imaging of breast: Secondary | ICD-10-CM

## 2015-12-28 LAB — CYTOLOGY - PAP

## 2016-01-04 ENCOUNTER — Other Ambulatory Visit: Payer: Self-pay | Admitting: Obstetrics & Gynecology

## 2016-01-04 ENCOUNTER — Ambulatory Visit (HOSPITAL_COMMUNITY)
Admission: RE | Admit: 2016-01-04 | Discharge: 2016-01-04 | Disposition: A | Discharge status: Home / Self Care | Payer: BLUE CROSS/BLUE SHIELD | Source: Ambulatory Visit | Attending: Obstetrics & Gynecology | Admitting: Obstetrics & Gynecology

## 2016-01-04 DIAGNOSIS — R921 Mammographic calcification found on diagnostic imaging of breast: Secondary | ICD-10-CM | POA: Insufficient documentation

## 2016-01-04 DIAGNOSIS — R928 Other abnormal and inconclusive findings on diagnostic imaging of breast: Secondary | ICD-10-CM

## 2016-01-13 ENCOUNTER — Ambulatory Visit
Admission: RE | Admit: 2016-01-13 | Discharge: 2016-01-13 | Disposition: A | Discharge status: Home / Self Care | Payer: BLUE CROSS/BLUE SHIELD | Source: Ambulatory Visit | Attending: Obstetrics & Gynecology | Admitting: Obstetrics & Gynecology

## 2016-01-13 DIAGNOSIS — R921 Mammographic calcification found on diagnostic imaging of breast: Secondary | ICD-10-CM

## 2016-06-12 ENCOUNTER — Other Ambulatory Visit: Payer: Self-pay | Admitting: Obstetrics & Gynecology

## 2016-06-12 DIAGNOSIS — D242 Benign neoplasm of left breast: Secondary | ICD-10-CM

## 2016-07-21 ENCOUNTER — Ambulatory Visit
Admission: RE | Admit: 2016-07-21 | Discharge: 2016-07-21 | Disposition: A | Discharge status: Home / Self Care | Payer: BLUE CROSS/BLUE SHIELD | Source: Ambulatory Visit | Attending: Obstetrics & Gynecology | Admitting: Obstetrics & Gynecology

## 2016-07-21 DIAGNOSIS — D242 Benign neoplasm of left breast: Secondary | ICD-10-CM

## 2016-11-09 ENCOUNTER — Other Ambulatory Visit: Payer: Self-pay | Admitting: Obstetrics & Gynecology

## 2016-12-26 ENCOUNTER — Other Ambulatory Visit: Payer: BLUE CROSS/BLUE SHIELD | Admitting: Obstetrics & Gynecology

## 2017-02-12 ENCOUNTER — Other Ambulatory Visit: Payer: Self-pay | Admitting: Obstetrics & Gynecology

## 2017-02-12 DIAGNOSIS — Z1231 Encounter for screening mammogram for malignant neoplasm of breast: Secondary | ICD-10-CM

## 2017-02-19 ENCOUNTER — Other Ambulatory Visit: Payer: BLUE CROSS/BLUE SHIELD | Admitting: Obstetrics & Gynecology

## 2017-02-26 ENCOUNTER — Other Ambulatory Visit (HOSPITAL_COMMUNITY)
Admission: RE | Admit: 2017-02-26 | Discharge: 2017-02-26 | Disposition: A | Discharge status: Home / Self Care | Payer: BLUE CROSS/BLUE SHIELD | Source: Ambulatory Visit | Attending: Obstetrics & Gynecology | Admitting: Obstetrics & Gynecology

## 2017-02-26 ENCOUNTER — Encounter (INDEPENDENT_AMBULATORY_CARE_PROVIDER_SITE_OTHER): Payer: Self-pay

## 2017-02-26 ENCOUNTER — Ambulatory Visit (INDEPENDENT_AMBULATORY_CARE_PROVIDER_SITE_OTHER): Payer: BLUE CROSS/BLUE SHIELD | Admitting: Obstetrics & Gynecology

## 2017-02-26 ENCOUNTER — Encounter: Payer: Self-pay | Admitting: Obstetrics & Gynecology

## 2017-02-26 VITALS — BP 120/80 | HR 80 | Ht 68.0 in | Wt 192.0 lb

## 2017-02-26 DIAGNOSIS — Z01419 Encounter for gynecological examination (general) (routine) without abnormal findings: Secondary | ICD-10-CM | POA: Diagnosis present

## 2017-02-26 DIAGNOSIS — Z1212 Encounter for screening for malignant neoplasm of rectum: Secondary | ICD-10-CM

## 2017-02-26 DIAGNOSIS — Z1211 Encounter for screening for malignant neoplasm of colon: Secondary | ICD-10-CM

## 2017-02-26 MED ORDER — CONJ ESTROGENS-BAZEDOXIFENE 0.45-20 MG PO TABS: 1.0000 | ORAL_TABLET | Freq: Every day | ORAL | 11 refills | Status: DC

## 2017-02-26 MED ORDER — ESTRADIOL 0.52 MG/0.87 GM (0.06%) TD GEL: TRANSDERMAL | 11 refills | Status: DC

## 2017-02-26 NOTE — Progress Notes (Signed)
Subjective:     Rebecca Williams is a 56 y.o. female here for a routine exam.  No LMP recorded. Patient is postmenopausal. G1P1 Birth Control Method:  Post menopausal Menstrual Calendar(currently): amenorrheic  Current complaints: none, minimal hot flushes.   Current acute medical issues:  none   Recent Gynecologic History No LMP recorded. Patient is postmenopausal. Last Pap: 2017,  normal Last mammogram: 2018,  Interval required biospy, next scheduled 03/13/2017  Past Medical History:  Diagnosis Date  . Allergy   . Hypertension     Past Surgical History:  Procedure Laterality Date  . CESAREAN SECTION    . ENDOMETRIAL ABLATION      OB History    Gravida Para Term Preterm AB Living   1 1           SAB TAB Ectopic Multiple Live Births                  Social History   Social History  . Marital status: Married    Spouse name: N/A  . Number of children: N/A  . Years of education: N/A   Social History Main Topics  . Smoking status: Never Smoker  . Smokeless tobacco: Never Used  . Alcohol use No  . Drug use: No  . Sexual activity: Yes    Birth control/ protection: Post-menopausal   Other Topics Concern  . None   Social History Narrative  . None    Family History  Problem Relation Age of Onset  . Diabetes Father   . Hypertension Father   . Hyperlipidemia Father   . Diabetes Mother   . Hypertension Mother   . Hyperlipidemia Mother      Current Outpatient Prescriptions:  .  Ascorbic Acid (VITAMIN C) 100 MG tablet, Take 100 mg by mouth daily., Disp: , Rfl:  .  Biotin 5000 MCG TABS, Take by mouth., Disp: , Rfl:  .  BLACK COHOSH PO, Take by mouth., Disp: , Rfl:  .  Conj Estrogens-Bazedoxifene (DUAVEE) 0.45-20 MG TABS, Take 1 tablet by mouth daily., Disp: 30 tablet, Rfl: 11 .  cyanocobalamin 100 MCG tablet, Take 100 mcg by mouth daily., Disp: , Rfl:  .  Estradiol (ELESTRIN) 0.52 MG/0.87 GM (0.06%) GEL, USE 1 PUMP DAILY, INCREASE IF NEEDED, Disp: 52 g, Rfl:  11 .  fexofenadine-pseudoephedrine (ALLEGRA-D) 60-120 MG per tablet, Take 1 tablet by mouth 2 (two) times daily., Disp: , Rfl:  .  hydrochlorothiazide (MICROZIDE) 12.5 MG capsule, Take 12.5 mg by mouth daily., Disp: , Rfl:  .  zinc gluconate 50 MG tablet, Take 50 mg by mouth daily., Disp: , Rfl:   Review of Systems  Review of Systems  Constitutional: Negative for fever, chills, weight loss, malaise/fatigue and diaphoresis.  HENT: Negative for hearing loss, ear pain, nosebleeds, congestion, sore throat, neck pain, tinnitus and ear discharge.   Eyes: Negative for blurred vision, double vision, photophobia, pain, discharge and redness.  Respiratory: Negative for cough, hemoptysis, sputum production, shortness of breath, wheezing and stridor.   Cardiovascular: Negative for chest pain, palpitations, orthopnea, claudication, leg swelling and PND.  Gastrointestinal: negative for abdominal pain. Negative for heartburn, nausea, vomiting, diarrhea, constipation, blood in stool and melena.  Genitourinary: Negative for dysuria, urgency, frequency, hematuria and flank pain.  Musculoskeletal: Negative for myalgias, back pain, joint pain and falls.  Skin: Negative for itching and rash.  Neurological: Negative for dizziness, tingling, tremors, sensory change, speech change, focal weakness, seizures, loss of consciousness, weakness and  headaches.  Endo/Heme/Allergies: Negative for environmental allergies and polydipsia. Does not bruise/bleed easily.  Psychiatric/Behavioral: Negative for depression, suicidal ideas, hallucinations, memory loss and substance abuse. The patient is not nervous/anxious and does not have insomnia.        Objective:  Blood pressure 120/80, pulse 80, height 5\' 8"  (1.727 m), weight 192 lb (87.1 kg).   Physical Exam  Vitals reviewed. Constitutional: She is oriented to person, place, and time. She appears well-developed and well-nourished.  HENT:  Head: Normocephalic and atraumatic.         Right Ear: External ear normal.  Left Ear: External ear normal.  Nose: Nose normal.  Mouth/Throat: Oropharynx is clear and moist.  Eyes: Conjunctivae and EOM are normal. Pupils are equal, round, and reactive to light. Right eye exhibits no discharge. Left eye exhibits no discharge. No scleral icterus.  Neck: Normal range of motion. Neck supple. No tracheal deviation present. No thyromegaly present.  Cardiovascular: Normal rate, regular rhythm, normal heart sounds and intact distal pulses.  Exam reveals no gallop and no friction rub.   No murmur heard. Respiratory: Effort normal and breath sounds normal. No respiratory distress. She has no wheezes. She has no rales. She exhibits no tenderness.  GI: Soft. Bowel sounds are normal. She exhibits no distension and no mass. There is no tenderness. There is no rebound and no guarding.  Genitourinary:  Breasts no masses skin changes or nipple changes bilaterally      Vulva is normal without lesions Vagina is pink moist without discharge Cervix normal in appearance and pap is done Uterus is normal size shape and contour Adnexa is negative with normal sized ovaries  {Rectal    hemoccult negative, normal tone, no masses  Musculoskeletal: Normal range of motion. She exhibits no edema and no tenderness.  Neurological: She is alert and oriented to person, place, and time. She has normal reflexes. She displays normal reflexes. No cranial nerve deficit. She exhibits normal muscle tone. Coordination normal.  Skin: Skin is warm and dry. No rash noted. No erythema. No pallor.  Psychiatric: She has a normal mood and affect. Her behavior is normal. Judgment and thought content normal.       Medications Ordered at today's visit: Meds ordered this encounter  Medications  . Estradiol (ELESTRIN) 0.52 MG/0.87 GM (0.06%) GEL    Sig: USE 1 PUMP DAILY, INCREASE IF NEEDED    Dispense:  52 g    Refill:  11  . Conj Estrogens-Bazedoxifene (DUAVEE) 0.45-20 MG  TABS    Sig: Take 1 tablet by mouth daily.    Dispense:  30 tablet    Refill:  11    This prescription was filled on 11/09/2016. Any refills authorized will be placed on file.    Other orders placed at today's visit: No orders of the defined types were placed in this encounter.     Assessment:    Healthy female exam.   postmenopuasal doing well on HRT Plan:    Contraception: post menopausal status. Hormone replacement therapy: hormone replacement therapy: as abov. Mammogram ordered. Follow up in: 1 year.     Return in about 1 year (around 02/26/2018) for yearly, with Dr Elonda Husky.

## 2017-02-26 NOTE — Addendum Note (Signed)
Addended by: Diona Fanti A on: 02/26/2017 10:09 AM   Modules accepted: Orders

## 2017-03-02 LAB — CYTOLOGY - PAP
Diagnosis: NEGATIVE
HPV: NOT DETECTED

## 2017-03-12 ENCOUNTER — Ambulatory Visit: Payer: BLUE CROSS/BLUE SHIELD

## 2017-03-13 ENCOUNTER — Ambulatory Visit
Admission: RE | Admit: 2017-03-13 | Discharge: 2017-03-13 | Disposition: A | Discharge status: Home / Self Care | Payer: BLUE CROSS/BLUE SHIELD | Source: Ambulatory Visit | Attending: Obstetrics & Gynecology | Admitting: Obstetrics & Gynecology

## 2017-03-13 DIAGNOSIS — Z1231 Encounter for screening mammogram for malignant neoplasm of breast: Secondary | ICD-10-CM

## 2017-04-02 ENCOUNTER — Encounter: Payer: Self-pay | Admitting: Obstetrics & Gynecology

## 2017-04-02 ENCOUNTER — Ambulatory Visit (INDEPENDENT_AMBULATORY_CARE_PROVIDER_SITE_OTHER): Payer: BLUE CROSS/BLUE SHIELD | Admitting: Obstetrics & Gynecology

## 2017-04-02 VITALS — BP 110/70 | HR 65 | Ht 68.0 in | Wt 189.5 lb

## 2017-04-02 DIAGNOSIS — L299 Pruritus, unspecified: Secondary | ICD-10-CM | POA: Diagnosis not present

## 2017-04-02 MED ORDER — ZOLPIDEM TARTRATE 10 MG PO TABS: 10.0000 mg | ORAL_TABLET | Freq: Every evening | ORAL | 3 refills | Status: DC | PRN

## 2017-04-02 MED ORDER — RANITIDINE HCL 150 MG PO TABS: 150.0000 mg | ORAL_TABLET | Freq: Two times a day (BID) | ORAL | 12 refills | Status: DC

## 2017-04-02 MED ORDER — LEVOCETIRIZINE DIHYDROCHLORIDE 5 MG PO TABS: 5.0000 mg | ORAL_TABLET | Freq: Every evening | ORAL | 12 refills | Status: DC

## 2017-04-02 NOTE — Progress Notes (Signed)
Chief Complaint  Patient presents with  . feels fatigued    hot flashes; itches constantly all over; ? hormones or thyroid    Blood pressure 110/70, pulse 65, height 5\' 8"  (1.727 m), weight 189 lb 8 oz (86 kg).  56 y.o. G1P1001 No LMP recorded. Patient is postmenopausal. The current method of family planning is post menopausal status.  Outpatient Encounter Medications as of 04/02/2017  Medication Sig  . Ascorbic Acid (VITAMIN C) 100 MG tablet Take 100 mg by mouth daily.  . Biotin 5000 MCG TABS Take by mouth daily.   Marland Kitchen BLACK COHOSH PO Take by mouth 2 (two) times daily.   . Cholecalciferol (VITAMIN D PO) Take by mouth daily.  Marland Kitchen Conj Estrogens-Bazedoxifene (DUAVEE) 0.45-20 MG TABS Take 1 tablet by mouth daily.  . cyanocobalamin 100 MCG tablet Take 100 mcg by mouth daily.  . fexofenadine-pseudoephedrine (ALLEGRA-D) 60-120 MG per tablet Take 1 tablet by mouth as needed.   . hydrochlorothiazide (MICROZIDE) 12.5 MG capsule Take 12.5 mg by mouth daily.  Marland Kitchen zinc gluconate 50 MG tablet Take 50 mg by mouth daily.  . [DISCONTINUED] Estradiol (ELESTRIN) 0.52 MG/0.87 GM (0.06%) GEL USE 1 PUMP DAILY, INCREASE IF NEEDED (Patient taking differently: USE 2 PUMP DAILY, INCREASE IF NEEDED)  . levocetirizine (XYZAL ALLERGY 24HR) 5 MG tablet Take 1 tablet (5 mg total) by mouth every evening.  . ranitidine (ZANTAC) 150 MG tablet Take 1 tablet (150 mg total) by mouth 2 (two) times daily.  . [DISCONTINUED] zolpidem (AMBIEN) 10 MG tablet Take 1 tablet (10 mg total) by mouth at bedtime as needed for sleep.   No facility-administered encounter medications on file as of 04/02/2017.     Subjective Rebecca Williams comes in with a relatively sudden onset of severe itching anxiety and insomnia It appears to me that the itching came first which led to insomnia which is now caused anxiety I've never single and lives this she really appears to be in the right She really is going crazy with the itching is been going on for  about 48 hours and she is getting no relief She's taken oatmeal baths nothing is helping Benadryl is not even help  Objective Blood pressure 110/70, pulse 65, height 5\' 8"  (1.727 m), weight 189 lb 8 oz (86 kg).  General this is a well-developed well-nourished female who is in mild-to-moderate distress from itching  Pertinent ROS   Labs or studies     Impression Diagnoses this Encounter::   ICD-10-CM   1. Pruritus L29.9    I believe this is a mast cell mediated anti IgE issue, no rash    Established relevant diagnosis(es):   Plan/Recommendations: Meds ordered this encounter  Medications  . ranitidine (ZANTAC) 150 MG tablet    Sig: Take 1 tablet (150 mg total) by mouth 2 (two) times daily.    Dispense:  60 tablet    Refill:  12  . levocetirizine (XYZAL ALLERGY 24HR) 5 MG tablet    Sig: Take 1 tablet (5 mg total) by mouth every evening.    Dispense:  30 tablet    Refill:  12  . DISCONTD: zolpidem (AMBIEN) 10 MG tablet    Sig: Take 1 tablet (10 mg total) by mouth at bedtime as needed for sleep.    Dispense:  30 tablet    Refill:  3    Labs or Scans Ordered: No orders of the defined types were placed in this encounter.   Management:: Patient  is begun on xyzal and Zantac for the possibility of anti-IgE antibodies causing disruption of mast cells I am also going to give her Ambien for sleep I will see her back in about 3 weeks or prior to that time if needed    Follow up Return in about 3 weeks (around 04/23/2017) for Follow up, with Dr Elonda Husky.     All questions were answered.  Past Medical History:  Diagnosis Date  . Allergy   . Hypertension     Past Surgical History:  Procedure Laterality Date  . BREAST BIOPSY    . CESAREAN SECTION    . ENDOMETRIAL ABLATION      OB History    Gravida Para Term Preterm AB Living   1 1 1     1    SAB TAB Ectopic Multiple Live Births           1      Allergies  Allergen Reactions  . Sulfur Nausea Only     Social History   Socioeconomic History  . Marital status: Married    Spouse name: None  . Number of children: None  . Years of education: None  . Highest education level: None  Social Needs  . Financial resource strain: None  . Food insecurity - worry: None  . Food insecurity - inability: None  . Transportation needs - medical: None  . Transportation needs - non-medical: None  Occupational History  . None  Tobacco Use  . Smoking status: Never Smoker  . Smokeless tobacco: Never Used  Substance and Sexual Activity  . Alcohol use: No  . Drug use: No  . Sexual activity: Yes    Birth control/protection: Post-menopausal  Other Topics Concern  . None  Social History Narrative  . None    Family History  Problem Relation Age of Onset  . Diabetes Father   . Hypertension Father   . Hyperlipidemia Father   . Diabetes Mother   . Hypertension Mother   . Hyperlipidemia Mother

## 2017-04-16 ENCOUNTER — Ambulatory Visit: Payer: BLUE CROSS/BLUE SHIELD | Admitting: Obstetrics & Gynecology

## 2017-04-23 ENCOUNTER — Encounter: Payer: Self-pay | Admitting: Obstetrics & Gynecology

## 2017-04-23 ENCOUNTER — Ambulatory Visit (INDEPENDENT_AMBULATORY_CARE_PROVIDER_SITE_OTHER): Payer: BLUE CROSS/BLUE SHIELD | Admitting: Obstetrics & Gynecology

## 2017-04-23 VITALS — BP 140/80 | HR 56 | Ht 68.0 in | Wt 193.0 lb

## 2017-04-23 DIAGNOSIS — L299 Pruritus, unspecified: Secondary | ICD-10-CM | POA: Diagnosis not present

## 2017-04-23 DIAGNOSIS — Z72820 Sleep deprivation: Secondary | ICD-10-CM

## 2017-04-23 NOTE — Progress Notes (Signed)
Chief Complaint  Patient presents with  . Follow-up    Blood pressure 140/80, pulse (!) 56, height 5\' 8"  (1.727 m), weight 193 lb (87.5 kg).  56 y.o. G1P1001 No LMP recorded. Patient is postmenopausal. The current method of family planning is post menopausal status.  Outpatient Encounter Medications as of 04/23/2017  Medication Sig  . Ascorbic Acid (VITAMIN C) 100 MG tablet Take 100 mg by mouth daily.  . Biotin 5000 MCG TABS Take by mouth daily.   Marland Kitchen BLACK COHOSH PO Take by mouth 2 (two) times daily.   . Cholecalciferol (VITAMIN D PO) Take by mouth daily.  Marland Kitchen Conj Estrogens-Bazedoxifene (DUAVEE) 0.45-20 MG TABS Take 1 tablet by mouth daily.  . cyanocobalamin 100 MCG tablet Take 100 mcg by mouth daily.  . fexofenadine-pseudoephedrine (ALLEGRA-D) 60-120 MG per tablet Take 1 tablet by mouth as needed.   . hydrochlorothiazide (MICROZIDE) 12.5 MG capsule Take 12.5 mg by mouth daily.  Marland Kitchen levocetirizine (XYZAL ALLERGY 24HR) 5 MG tablet Take 1 tablet (5 mg total) by mouth every evening.  . ranitidine (ZANTAC) 150 MG tablet Take 1 tablet (150 mg total) by mouth 2 (two) times daily.  Marland Kitchen zinc gluconate 50 MG tablet Take 50 mg by mouth daily.  . [DISCONTINUED] Estradiol (ELESTRIN) 0.52 MG/0.87 GM (0.06%) GEL USE 1 PUMP DAILY, INCREASE IF NEEDED (Patient taking differently: USE 2 PUMP DAILY, INCREASE IF NEEDED)  . [DISCONTINUED] zolpidem (AMBIEN) 10 MG tablet Take 1 tablet (10 mg total) by mouth at bedtime as needed for sleep.   No facility-administered encounter medications on file as of 04/23/2017.     Subjective I Preston Fleeting about 3 weeks ago with sudden onset of itching anxiety sleep deprivation I'm not sure which was a chicken and 8 but I was suspicious that her itching led to the anxiety and the sleep deprivation Seem she had some sort of unusual allergy could even be an anti-IgG E problem and placed her on Zantac in size all with Ambien and she got better pretty quickly A she is  essentially without any itching at all and denies sleeping normally  Objective   Pertinent ROS No burning with urination, frequency or urgency No nausea, vomiting or diarrhea Nor fever chills or other constitutional symptoms   Labs or studies     Impression Diagnoses this Encounter::   ICD-10-CM   1. Pruritus L29.9    Resolved on zantac and xyzal  2. Sleep deprivation Z72.820    managed with ambien     Established relevant diagnosis(es):   Plan/Recommendations: No orders of the defined types were placed in this encounter.   Labs or Scans Ordered: No orders of the defined types were placed in this encounter.   Management:: Patient will continue to use Zantac in size all as needed as well as Ambien but only as needed and all of them she seems to be doing much better  Follow up Return if symptoms worsen or fail to improve.        Face to face time:  10 minutes  Greater than 50% of the visit time was spent in counseling and coordination of care with the patient.  The summary and outline of the counseling and care coordination is summarized in the note above.   All questions were answered.  Past Medical History:  Diagnosis Date  . Allergy   . Hypertension     Past Surgical History:  Procedure Laterality Date  . BREAST BIOPSY    .  CESAREAN SECTION    . ENDOMETRIAL ABLATION      OB History    Gravida Para Term Preterm AB Living   1 1 1     1    SAB TAB Ectopic Multiple Live Births           1      Allergies  Allergen Reactions  . Sulfur Nausea Only    Social History   Socioeconomic History  . Marital status: Married    Spouse name: None  . Number of children: None  . Years of education: None  . Highest education level: None  Social Needs  . Financial resource strain: None  . Food insecurity - worry: None  . Food insecurity - inability: None  . Transportation needs - medical: None  . Transportation needs - non-medical: None    Occupational History  . None  Tobacco Use  . Smoking status: Never Smoker  . Smokeless tobacco: Never Used  Substance and Sexual Activity  . Alcohol use: No  . Drug use: No  . Sexual activity: Yes    Birth control/protection: Post-menopausal  Other Topics Concern  . None  Social History Narrative  . None    Family History  Problem Relation Age of Onset  . Diabetes Father   . Hypertension Father   . Hyperlipidemia Father   . Diabetes Mother   . Hypertension Mother   . Hyperlipidemia Mother

## 2017-06-18 ENCOUNTER — Telehealth: Payer: Self-pay | Admitting: *Deleted

## 2017-06-18 ENCOUNTER — Other Ambulatory Visit: Payer: Self-pay | Admitting: Obstetrics & Gynecology

## 2017-06-18 MED ORDER — ESTRADIOL 1 MG/GM TD GEL: 1.0000 | Freq: Every day | TRANSDERMAL | 11 refills | Status: DC

## 2017-06-18 NOTE — Telephone Encounter (Signed)
E prescribed divigel for patient

## 2017-06-18 NOTE — Telephone Encounter (Signed)
Pt called stating that her drug store is out of Elestrin gel. She is asking for recommendations. Advised pt that she could try calling other drug stores to see if they have it in. She states that her drug store does not have an ETA for the medication and that the manufacturer is out. Informed pt that Dr Elonda Husky was not in the office today but I would send a request for a different medication to him.

## 2017-06-18 NOTE — Telephone Encounter (Signed)
Informed pt that Rx for Divigel had been sent to pharmacy.

## 2018-02-11 ENCOUNTER — Other Ambulatory Visit: Payer: Self-pay | Admitting: Obstetrics & Gynecology

## 2018-02-11 DIAGNOSIS — Z1231 Encounter for screening mammogram for malignant neoplasm of breast: Secondary | ICD-10-CM

## 2018-03-01 ENCOUNTER — Other Ambulatory Visit: Payer: BLUE CROSS/BLUE SHIELD | Admitting: Obstetrics & Gynecology

## 2018-03-07 ENCOUNTER — Encounter: Payer: Self-pay | Admitting: Obstetrics & Gynecology

## 2018-03-07 ENCOUNTER — Other Ambulatory Visit (HOSPITAL_COMMUNITY)
Admission: RE | Admit: 2018-03-07 | Discharge: 2018-03-07 | Disposition: A | Discharge status: Home / Self Care | Payer: BLUE CROSS/BLUE SHIELD | Source: Ambulatory Visit | Attending: Obstetrics & Gynecology | Admitting: Obstetrics & Gynecology

## 2018-03-07 ENCOUNTER — Ambulatory Visit (INDEPENDENT_AMBULATORY_CARE_PROVIDER_SITE_OTHER): Payer: BLUE CROSS/BLUE SHIELD | Admitting: Obstetrics & Gynecology

## 2018-03-07 VITALS — BP 145/90 | HR 59 | Ht 68.0 in | Wt 200.0 lb

## 2018-03-07 DIAGNOSIS — R5383 Other fatigue: Secondary | ICD-10-CM | POA: Diagnosis not present

## 2018-03-07 DIAGNOSIS — Z01419 Encounter for gynecological examination (general) (routine) without abnormal findings: Secondary | ICD-10-CM

## 2018-03-07 DIAGNOSIS — I10 Essential (primary) hypertension: Secondary | ICD-10-CM | POA: Diagnosis not present

## 2018-03-07 DIAGNOSIS — R635 Abnormal weight gain: Secondary | ICD-10-CM | POA: Insufficient documentation

## 2018-03-07 NOTE — Progress Notes (Signed)
Subjective:     Rebecca Williams is a 57 y.o. female here for a routine exam.  No LMP recorded. Patient is postmenopausal. G1P1001 Birth Control Method:  menopausl Menstrual Calendar(currently): amenorrheic  Current complaints: lethargy and weight gain.   Current acute medical issues:     Recent Gynecologic History No LMP recorded. Patient is postmenopausal. Last Pap: 2018,  normal Last mammogram: 03/13/2017,  normal  Past Medical History:  Diagnosis Date  . Allergy   . Hypertension     Past Surgical History:  Procedure Laterality Date  . BREAST BIOPSY    . CESAREAN SECTION    . ENDOMETRIAL ABLATION      OB History    Gravida  1   Para  1   Term  1   Preterm      AB      Living  1     SAB      TAB      Ectopic      Multiple      Live Births  1           Social History   Socioeconomic History  . Marital status: Married    Spouse name: Not on file  . Number of children: Not on file  . Years of education: Not on file  . Highest education level: Not on file  Occupational History  . Not on file  Social Needs  . Financial resource strain: Not on file  . Food insecurity:    Worry: Not on file    Inability: Not on file  . Transportation needs:    Medical: Not on file    Non-medical: Not on file  Tobacco Use  . Smoking status: Never Smoker  . Smokeless tobacco: Never Used  Substance and Sexual Activity  . Alcohol use: No  . Drug use: No  . Sexual activity: Yes    Birth control/protection: Post-menopausal  Lifestyle  . Physical activity:    Days per week: Not on file    Minutes per session: Not on file  . Stress: Not on file  Relationships  . Social connections:    Talks on phone: Not on file    Gets together: Not on file    Attends religious service: Not on file    Active member of club or organization: Not on file    Attends meetings of clubs or organizations: Not on file    Relationship status: Not on file  Other Topics Concern   . Not on file  Social History Narrative  . Not on file    Family History  Problem Relation Age of Onset  . Diabetes Father   . Hypertension Father   . Hyperlipidemia Father   . Diabetes Mother   . Hypertension Mother   . Hyperlipidemia Mother      Current Outpatient Medications:  .  Biotin 5000 MCG TABS, Take by mouth daily. , Disp: , Rfl:  .  BLACK COHOSH PO, Take by mouth 2 (two) times daily. , Disp: , Rfl:  .  busPIRone (BUSPAR) 5 MG tablet, Take 5 mg by mouth 3 (three) times daily., Disp: , Rfl:  .  Cholecalciferol (VITAMIN D PO), Take by mouth daily., Disp: , Rfl:  .  Conj Estrogens-Bazedoxifene (DUAVEE) 0.45-20 MG TABS, Take 1 tablet by mouth daily., Disp: 30 tablet, Rfl: 11 .  Estradiol (DIVIGEL) 1 MG/GM GEL, Place 1 packet daily onto the skin., Disp: 30 g, Rfl: 11 .  hydrochlorothiazide (MICROZIDE) 12.5 MG capsule, Take 12.5 mg by mouth daily., Disp: , Rfl:  .  hydrOXYzine (ATARAX/VISTARIL) 25 MG tablet, Take 25 mg by mouth 3 (three) times daily as needed., Disp: , Rfl:  .  levocetirizine (XYZAL ALLERGY 24HR) 5 MG tablet, Take 1 tablet (5 mg total) by mouth every evening., Disp: 30 tablet, Rfl: 12 .  ranitidine (ZANTAC) 150 MG tablet, Take 1 tablet (150 mg total) by mouth 2 (two) times daily., Disp: 60 tablet, Rfl: 12  Review of Systems  Review of Systems  Constitutional: Negative for fever, chills, weight loss, malaise/fatigue and diaphoresis.  HENT: Negative for hearing loss, ear pain, nosebleeds, congestion, sore throat, neck pain, tinnitus and ear discharge.   Eyes: Negative for blurred vision, double vision, photophobia, pain, discharge and redness.  Respiratory: Negative for cough, hemoptysis, sputum production, shortness of breath, wheezing and stridor.   Cardiovascular: Negative for chest pain, palpitations, orthopnea, claudication, leg swelling and PND.  Gastrointestinal: negative for abdominal pain. Negative for heartburn, nausea, vomiting, diarrhea,  constipation, blood in stool and melena.  Genitourinary: Negative for dysuria, urgency, frequency, hematuria and flank pain.  Musculoskeletal: Negative for myalgias, back pain, joint pain and falls.  Skin: Negative for itching and rash.  Neurological: Negative for dizziness, tingling, tremors, sensory change, speech change, focal weakness, seizures, loss of consciousness, weakness and headaches.  Endo/Heme/Allergies: Negative for environmental allergies and polydipsia. Does not bruise/bleed easily.  Psychiatric/Behavioral: Negative for depression, suicidal ideas, hallucinations, memory loss and substance abuse. The patient is not nervous/anxious and does not have insomnia.        Objective:  Blood pressure (!) 145/90, pulse (!) 59, height 5\' 8"  (1.727 m), weight 200 lb (90.7 kg).   Physical Exam  Vitals reviewed. Constitutional: She is oriented to person, place, and time. She appears well-developed and well-nourished.  HENT:  Head: Normocephalic and atraumatic.        Right Ear: External ear normal.  Left Ear: External ear normal.  Nose: Nose normal.  Mouth/Throat: Oropharynx is clear and moist.  Eyes: Conjunctivae and EOM are normal. Pupils are equal, round, and reactive to light. Right eye exhibits no discharge. Left eye exhibits no discharge. No scleral icterus.  Neck: Normal range of motion. Neck supple. No tracheal deviation present. No thyromegaly present.  Cardiovascular: Normal rate, regular rhythm, normal heart sounds and intact distal pulses.  Exam reveals no gallop and no friction rub.   No murmur heard. Respiratory: Effort normal and breath sounds normal. No respiratory distress. She has no wheezes. She has no rales. She exhibits no tenderness.  GI: Soft. Bowel sounds are normal. She exhibits no distension and no mass. There is no tenderness. There is no rebound and no guarding.  Genitourinary:  Breasts no masses skin changes or nipple changes bilaterally      Vulva is normal  without lesions Vagina is pink moist without discharge Cervix normal in appearance and pap is done Uterus is normal size shape and contour Adnexa is negative with normal sized ovaries  {Rectal    hemoccult negative, normal tone, no masses  Musculoskeletal: Normal range of motion. She exhibits no edema and no tenderness.  Neurological: She is alert and oriented to person, place, and time. She has normal reflexes. She displays normal reflexes. No cranial nerve deficit. She exhibits normal muscle tone. Coordination normal.  Skin: Skin is warm and dry. No rash noted. No erythema. No pallor.  Psychiatric: She has a normal mood and affect. Her behavior is normal.  Judgment and thought content normal.       Medications Ordered at today's visit: No orders of the defined types were placed in this encounter.   Other orders placed at today's visit: Orders Placed This Encounter  Procedures  . TSH  . Vitamin D 1,25 dihydroxy      Assessment:    Healthy female exam.   lethargy and weight gain Plan:    check TSH vitamin D level Continue topical estrogen with the duavee     Return in about 1 year (around 03/08/2019) for yearly, with Dr Elonda Husky.

## 2018-03-08 LAB — CYTOLOGY - PAP
DIAGNOSIS: NEGATIVE
HPV (WINDOPATH): NOT DETECTED

## 2018-03-08 LAB — TSH: TSH: 2.59 u[IU]/mL (ref 0.450–4.500)

## 2018-03-12 LAB — VITAMIN D 1,25 DIHYDROXY
Vitamin D 1, 25 (OH)2 Total: 32 pg/mL
Vitamin D2 1, 25 (OH)2: <10 pg/mL
Vitamin D3 1, 25 (OH)2: 32 pg/mL

## 2018-03-20 ENCOUNTER — Ambulatory Visit
Admission: RE | Admit: 2018-03-20 | Discharge: 2018-03-20 | Disposition: A | Discharge status: Home / Self Care | Payer: BLUE CROSS/BLUE SHIELD | Source: Ambulatory Visit | Attending: Obstetrics & Gynecology | Admitting: Obstetrics & Gynecology

## 2018-03-20 DIAGNOSIS — Z1231 Encounter for screening mammogram for malignant neoplasm of breast: Secondary | ICD-10-CM

## 2018-03-25 ENCOUNTER — Other Ambulatory Visit: Payer: Self-pay | Admitting: Obstetrics & Gynecology

## 2018-07-15 ENCOUNTER — Other Ambulatory Visit: Payer: Self-pay | Admitting: Obstetrics & Gynecology

## 2018-09-09 DIAGNOSIS — Z299 Encounter for prophylactic measures, unspecified: Secondary | ICD-10-CM | POA: Diagnosis not present

## 2018-09-09 DIAGNOSIS — M25561 Pain in right knee: Secondary | ICD-10-CM | POA: Diagnosis not present

## 2018-09-09 DIAGNOSIS — I10 Essential (primary) hypertension: Secondary | ICD-10-CM | POA: Diagnosis not present

## 2018-09-09 DIAGNOSIS — M170 Bilateral primary osteoarthritis of knee: Secondary | ICD-10-CM | POA: Diagnosis not present

## 2018-09-09 DIAGNOSIS — Z6831 Body mass index (BMI) 31.0-31.9, adult: Secondary | ICD-10-CM | POA: Diagnosis not present

## 2018-09-09 DIAGNOSIS — M25562 Pain in left knee: Secondary | ICD-10-CM | POA: Diagnosis not present

## 2018-09-09 DIAGNOSIS — M2560 Stiffness of unspecified joint, not elsewhere classified: Secondary | ICD-10-CM | POA: Diagnosis not present

## 2018-09-20 DIAGNOSIS — Z6831 Body mass index (BMI) 31.0-31.9, adult: Secondary | ICD-10-CM | POA: Diagnosis not present

## 2018-09-20 DIAGNOSIS — F419 Anxiety disorder, unspecified: Secondary | ICD-10-CM | POA: Diagnosis not present

## 2018-09-20 DIAGNOSIS — Z713 Dietary counseling and surveillance: Secondary | ICD-10-CM | POA: Diagnosis not present

## 2018-09-20 DIAGNOSIS — I10 Essential (primary) hypertension: Secondary | ICD-10-CM | POA: Diagnosis not present

## 2018-09-20 DIAGNOSIS — Z299 Encounter for prophylactic measures, unspecified: Secondary | ICD-10-CM | POA: Diagnosis not present

## 2018-10-09 DIAGNOSIS — Z85828 Personal history of other malignant neoplasm of skin: Secondary | ICD-10-CM | POA: Diagnosis not present

## 2018-10-09 DIAGNOSIS — L280 Lichen simplex chronicus: Secondary | ICD-10-CM | POA: Diagnosis not present

## 2018-10-09 DIAGNOSIS — L570 Actinic keratosis: Secondary | ICD-10-CM | POA: Diagnosis not present

## 2018-10-09 DIAGNOSIS — D485 Neoplasm of uncertain behavior of skin: Secondary | ICD-10-CM | POA: Diagnosis not present

## 2018-10-17 DIAGNOSIS — C44729 Squamous cell carcinoma of skin of left lower limb, including hip: Secondary | ICD-10-CM | POA: Diagnosis not present

## 2018-10-30 DIAGNOSIS — L280 Lichen simplex chronicus: Secondary | ICD-10-CM | POA: Diagnosis not present

## 2018-12-17 DIAGNOSIS — Z6832 Body mass index (BMI) 32.0-32.9, adult: Secondary | ICD-10-CM | POA: Diagnosis not present

## 2018-12-17 DIAGNOSIS — H6692 Otitis media, unspecified, left ear: Secondary | ICD-10-CM | POA: Diagnosis not present

## 2018-12-17 DIAGNOSIS — I4891 Unspecified atrial fibrillation: Secondary | ICD-10-CM | POA: Diagnosis not present

## 2018-12-17 DIAGNOSIS — Z299 Encounter for prophylactic measures, unspecified: Secondary | ICD-10-CM | POA: Diagnosis not present

## 2018-12-17 DIAGNOSIS — I10 Essential (primary) hypertension: Secondary | ICD-10-CM | POA: Diagnosis not present

## 2018-12-17 DIAGNOSIS — E1165 Type 2 diabetes mellitus with hyperglycemia: Secondary | ICD-10-CM | POA: Diagnosis not present

## 2018-12-17 DIAGNOSIS — R739 Hyperglycemia, unspecified: Secondary | ICD-10-CM | POA: Diagnosis not present

## 2019-02-12 ENCOUNTER — Telehealth: Payer: Self-pay | Admitting: *Deleted

## 2019-02-12 NOTE — Telephone Encounter (Signed)
Patient called stating her pharmacy told her to call to get samples of her hormone pills, patient states the manufacture is out of stock, patient states she can't be without her hormone pills. Please call patient

## 2019-02-12 NOTE — Telephone Encounter (Signed)
Called patient back. Rebecca Williams is on backorder from the manufacturer. Patient wanted to see if you can prescribe something else in the meantime. She states that she cannot be without the hormones.

## 2019-02-12 NOTE — Telephone Encounter (Signed)
Rebecca Williams is coming tomorrow hopefully we will have samples available tomorrow afternoon

## 2019-02-13 ENCOUNTER — Telehealth: Payer: Self-pay | Admitting: Obstetrics & Gynecology

## 2019-02-13 MED ORDER — PROGESTERONE MICRONIZED 200 MG PO CAPS: ORAL_CAPSULE | ORAL | 11 refills | Status: DC

## 2019-02-13 MED ORDER — ESTROGENS CONJUGATED 0.45 MG PO TABS: 0.4500 mg | ORAL_TABLET | Freq: Every day | ORAL | 11 refills | Status: DC

## 2019-02-13 NOTE — Telephone Encounter (Signed)
Pt calling to check about samples if we do not get any she needs something called in she takes last pill tomorrow will need then. Please call and advise pt if she needs to pick up here or at her pharamcy

## 2019-02-17 ENCOUNTER — Other Ambulatory Visit: Payer: Self-pay | Admitting: Obstetrics & Gynecology

## 2019-02-17 DIAGNOSIS — Z1231 Encounter for screening mammogram for malignant neoplasm of breast: Secondary | ICD-10-CM

## 2019-03-10 ENCOUNTER — Other Ambulatory Visit: Payer: BLUE CROSS/BLUE SHIELD | Admitting: Adult Health

## 2019-03-27 ENCOUNTER — Telehealth: Payer: Self-pay | Admitting: Obstetrics & Gynecology

## 2019-03-27 DIAGNOSIS — I10 Essential (primary) hypertension: Secondary | ICD-10-CM | POA: Diagnosis not present

## 2019-03-27 DIAGNOSIS — Z713 Dietary counseling and surveillance: Secondary | ICD-10-CM | POA: Diagnosis not present

## 2019-03-27 DIAGNOSIS — Z6832 Body mass index (BMI) 32.0-32.9, adult: Secondary | ICD-10-CM | POA: Diagnosis not present

## 2019-03-27 DIAGNOSIS — F419 Anxiety disorder, unspecified: Secondary | ICD-10-CM | POA: Diagnosis not present

## 2019-03-27 DIAGNOSIS — Z299 Encounter for prophylactic measures, unspecified: Secondary | ICD-10-CM | POA: Diagnosis not present

## 2019-03-27 NOTE — Telephone Encounter (Signed)
Pt states that she went to her PCP and they are needing her hormone medication changed. She is wanting to discuss with a nurse.

## 2019-03-28 ENCOUNTER — Telehealth: Payer: Self-pay | Admitting: *Deleted

## 2019-03-28 NOTE — Telephone Encounter (Signed)
The problem is duavee is not available for anybody, unfortunately  The prometrium is probably the issue  Stop the prometrium and take on ly the premarin for 6 weeks, then make a MyChart connect visit for 6 weeks and lets see how she is doing

## 2019-03-28 NOTE — Telephone Encounter (Signed)
Patient states she went to PCP thinking she had a yeast infection but she has extreme vaginal dryness and her breasts are very sensitive since starting the Premarin tables and Prometrium .   She is wondering if the 2 medications are too much for her. She was taking [facility] which worked great for her.  I informed her we are still out of those samples.  Pt is just wanting to know if there is any other medications she can use.  Please advise.

## 2019-03-28 NOTE — Telephone Encounter (Signed)
Patient informed per Dr Elonda Husky the problem is duavee and is not available for anybody, unfortunately.The prometrium is probably the issue. Advised to stop the prometrium and take only the premarin for 6 weeks, then make a MyChart connect visit for 6 weeks. Patient states she is already scheduled for pap and physical in October so she will just keep that appointment.

## 2019-04-03 ENCOUNTER — Ambulatory Visit
Admission: RE | Admit: 2019-04-03 | Discharge: 2019-04-03 | Disposition: A | Discharge status: Home / Self Care | Payer: BC Managed Care – PPO | Source: Ambulatory Visit | Attending: Obstetrics & Gynecology | Admitting: Obstetrics & Gynecology

## 2019-04-03 ENCOUNTER — Other Ambulatory Visit: Payer: Self-pay

## 2019-04-03 DIAGNOSIS — Z1231 Encounter for screening mammogram for malignant neoplasm of breast: Secondary | ICD-10-CM

## 2019-05-02 ENCOUNTER — Other Ambulatory Visit: Payer: Self-pay

## 2019-05-02 DIAGNOSIS — R6889 Other general symptoms and signs: Secondary | ICD-10-CM | POA: Diagnosis not present

## 2019-05-02 DIAGNOSIS — Z20822 Contact with and (suspected) exposure to covid-19: Secondary | ICD-10-CM

## 2019-05-03 LAB — NOVEL CORONAVIRUS, NAA: SARS-CoV-2, NAA: NOT DETECTED

## 2019-05-20 DIAGNOSIS — L570 Actinic keratosis: Secondary | ICD-10-CM | POA: Diagnosis not present

## 2019-05-27 ENCOUNTER — Other Ambulatory Visit (HOSPITAL_COMMUNITY)
Admission: RE | Admit: 2019-05-27 | Discharge: 2019-05-27 | Disposition: A | Discharge status: Home / Self Care | Payer: BC Managed Care – PPO | Source: Ambulatory Visit | Attending: Obstetrics & Gynecology | Admitting: Obstetrics & Gynecology

## 2019-05-27 ENCOUNTER — Other Ambulatory Visit: Payer: Self-pay

## 2019-05-27 ENCOUNTER — Ambulatory Visit (INDEPENDENT_AMBULATORY_CARE_PROVIDER_SITE_OTHER): Payer: BC Managed Care – PPO | Admitting: Adult Health

## 2019-05-27 ENCOUNTER — Encounter: Payer: Self-pay | Admitting: Adult Health

## 2019-05-27 VITALS — BP 128/88 | HR 72 | Ht 68.0 in | Wt 210.4 lb

## 2019-05-27 DIAGNOSIS — Z1212 Encounter for screening for malignant neoplasm of rectum: Secondary | ICD-10-CM | POA: Diagnosis not present

## 2019-05-27 DIAGNOSIS — Z1211 Encounter for screening for malignant neoplasm of colon: Secondary | ICD-10-CM | POA: Diagnosis not present

## 2019-05-27 DIAGNOSIS — Z79890 Hormone replacement therapy (postmenopausal): Secondary | ICD-10-CM

## 2019-05-27 DIAGNOSIS — Z01419 Encounter for gynecological examination (general) (routine) without abnormal findings: Secondary | ICD-10-CM

## 2019-05-27 LAB — HEMOCCULT GUIAC POC 1CARD (OFFICE): Fecal Occult Blood, POC: NEGATIVE

## 2019-05-27 MED ORDER — MEDROXYPROGESTERONE ACETATE 2.5 MG PO TABS: 2.5000 mg | ORAL_TABLET | Freq: Every day | ORAL | 12 refills | Status: DC

## 2019-05-27 NOTE — Progress Notes (Signed)
Patient ID: Rebecca Williams, female   DOB: 1961-06-13, 58 y.o.   MRN: [id number] History of Present Illness: Rebecca Williams is a 58 year old white female, married, G1P1, PM in for a well woman gyn exam and she wants a pap.Had normal pap last year with Dr Elonda Husky. PCP is Dr Woody Seller.   Current Medications, Allergies, Past Medical History, Past Surgical History, Family History and Social History were reviewed in [employer] record.     Review of Systems: Patient denies any headaches, hearing loss, fatigue, blurred vision, shortness of breath, chest pain, abdominal pain, problems with bowel movements, urination, or intercourse. No joint pain, has occasional hot flashes and moody at times.She had been on [facility] and could not get, so switched to estrogen and Prometrium and had vaginitis so stopped Prometrium and has been on unopposed estrogen.     Physical Exam:BP 128/88 (BP Location: Left Arm, Patient Position: Sitting, Cuff Size: Normal)   Pulse 72   Ht 5\' 8"  (1.727 m)   Wt 210 lb 6.4 oz (95.4 kg)   BMI 31.99 kg/m  General:  Well developed, well nourished, no acute distress Skin:  Warm and dry Neck:  Midline trachea, normal thyroid, good ROM, no lymphadenopathy Lungs; Clear to auscultation bilaterally Breast:  No dominant palpable mass, retraction, or nipple discharge Cardiovascular: Regular rate and rhythm Abdomen:  Soft, non tender, no hepatosplenomegaly Pelvic:  External genitalia is normal in appearance, no lesions.  The vagina is normal in appearance. Urethra has no lesions or masses. The cervix is smooth with stenotic os, pap with high risk HPV 16/18 genotyping performed.  Uterus is felt to be normal size, shape, and contour.  No adnexal masses or tenderness noted.Bladder is non tender, no masses felt. Rectal: Good sphincter tone, no polyps, or hemorrhoids felt.  Hemoccult negative. Extremities/musculoskeletal:  No swelling or varicosities noted, no clubbing or  cyanosis Psych:  No mood changes, alert and cooperative,seems happy Fall risk is low PHQ 2 score 0. Examination chaperoned by Levy Pupa LPN  Impression and Plan:  1. Encounter for gynecological examination with Papanicolaou smear of cervix Pap sent Physical with PCP Pap in 3 years if normal Talk meds in 1 year Mammogram yearly Colonoscopy per GI Labs with PCP   2. Screening for colorectal cancer   3. Hormone replacement therapy (HRT) Stop black cohosh Continue estradiol and divigel Explained to her she can't be on unopposed estrogen for long periods, will try low dose and let me know if any PMB  Will add provera 2.5 mg daily Meds ordered this encounter  Medications  . medroxyPROGESTERone (PROVERA) 2.5 MG tablet    Sig: Take 1 tablet (2.5 mg total) by mouth daily.    Dispense:  30 tablet    Refill:  12    Order Specific Question:   Supervising Provider    Answer:   Tania Ade H [[id number]]

## 2019-05-29 LAB — CYTOLOGY - PAP
Comment: NEGATIVE
Diagnosis: NEGATIVE
High risk HPV: NEGATIVE

## 2019-06-13 ENCOUNTER — Other Ambulatory Visit: Payer: Self-pay | Admitting: Obstetrics & Gynecology

## 2019-07-08 DIAGNOSIS — F419 Anxiety disorder, unspecified: Secondary | ICD-10-CM | POA: Diagnosis not present

## 2019-07-08 DIAGNOSIS — I10 Essential (primary) hypertension: Secondary | ICD-10-CM | POA: Diagnosis not present

## 2019-07-08 DIAGNOSIS — Z299 Encounter for prophylactic measures, unspecified: Secondary | ICD-10-CM | POA: Diagnosis not present

## 2019-07-08 DIAGNOSIS — Z1211 Encounter for screening for malignant neoplasm of colon: Secondary | ICD-10-CM | POA: Diagnosis not present

## 2019-07-08 DIAGNOSIS — Z1331 Encounter for screening for depression: Secondary | ICD-10-CM | POA: Diagnosis not present

## 2019-07-08 DIAGNOSIS — Z6832 Body mass index (BMI) 32.0-32.9, adult: Secondary | ICD-10-CM | POA: Diagnosis not present

## 2019-07-08 DIAGNOSIS — Z79899 Other long term (current) drug therapy: Secondary | ICD-10-CM | POA: Diagnosis not present

## 2019-07-08 DIAGNOSIS — Z0000 Encounter for general adult medical examination without abnormal findings: Secondary | ICD-10-CM | POA: Diagnosis not present

## 2019-08-05 DIAGNOSIS — M461 Sacroiliitis, not elsewhere classified: Secondary | ICD-10-CM | POA: Diagnosis not present

## 2019-08-05 DIAGNOSIS — Z6832 Body mass index (BMI) 32.0-32.9, adult: Secondary | ICD-10-CM | POA: Diagnosis not present

## 2019-08-05 DIAGNOSIS — M545 Low back pain: Secondary | ICD-10-CM | POA: Diagnosis not present

## 2019-08-05 DIAGNOSIS — N390 Urinary tract infection, site not specified: Secondary | ICD-10-CM | POA: Diagnosis not present

## 2019-08-05 DIAGNOSIS — I10 Essential (primary) hypertension: Secondary | ICD-10-CM | POA: Diagnosis not present

## 2019-08-05 DIAGNOSIS — Z299 Encounter for prophylactic measures, unspecified: Secondary | ICD-10-CM | POA: Diagnosis not present

## 2019-10-22 DIAGNOSIS — I10 Essential (primary) hypertension: Secondary | ICD-10-CM | POA: Diagnosis not present

## 2019-10-22 DIAGNOSIS — F419 Anxiety disorder, unspecified: Secondary | ICD-10-CM | POA: Diagnosis not present

## 2019-10-22 DIAGNOSIS — Z789 Other specified health status: Secondary | ICD-10-CM | POA: Diagnosis not present

## 2019-10-22 DIAGNOSIS — Z299 Encounter for prophylactic measures, unspecified: Secondary | ICD-10-CM | POA: Diagnosis not present

## 2019-11-18 DIAGNOSIS — L570 Actinic keratosis: Secondary | ICD-10-CM | POA: Diagnosis not present

## 2020-01-12 DIAGNOSIS — R21 Rash and other nonspecific skin eruption: Secondary | ICD-10-CM | POA: Diagnosis not present

## 2020-01-12 DIAGNOSIS — I10 Essential (primary) hypertension: Secondary | ICD-10-CM | POA: Diagnosis not present

## 2020-01-12 DIAGNOSIS — F419 Anxiety disorder, unspecified: Secondary | ICD-10-CM | POA: Diagnosis not present

## 2020-01-12 DIAGNOSIS — W57XXXA Bitten or stung by nonvenomous insect and other nonvenomous arthropods, initial encounter: Secondary | ICD-10-CM | POA: Diagnosis not present

## 2020-01-12 DIAGNOSIS — Z299 Encounter for prophylactic measures, unspecified: Secondary | ICD-10-CM | POA: Diagnosis not present

## 2020-01-13 ENCOUNTER — Other Ambulatory Visit: Payer: Self-pay | Admitting: Obstetrics & Gynecology

## 2020-05-07 DIAGNOSIS — Z299 Encounter for prophylactic measures, unspecified: Secondary | ICD-10-CM | POA: Diagnosis not present

## 2020-05-07 DIAGNOSIS — I10 Essential (primary) hypertension: Secondary | ICD-10-CM | POA: Diagnosis not present

## 2020-05-07 DIAGNOSIS — Z6833 Body mass index (BMI) 33.0-33.9, adult: Secondary | ICD-10-CM | POA: Diagnosis not present

## 2020-05-07 DIAGNOSIS — E669 Obesity, unspecified: Secondary | ICD-10-CM | POA: Diagnosis not present

## 2020-05-07 DIAGNOSIS — U071 COVID-19: Secondary | ICD-10-CM | POA: Diagnosis not present

## 2020-05-13 ENCOUNTER — Other Ambulatory Visit: Payer: Self-pay | Admitting: Adult Health

## 2020-05-14 DIAGNOSIS — I10 Essential (primary) hypertension: Secondary | ICD-10-CM | POA: Diagnosis not present

## 2020-05-14 DIAGNOSIS — Z6833 Body mass index (BMI) 33.0-33.9, adult: Secondary | ICD-10-CM | POA: Diagnosis not present

## 2020-05-14 DIAGNOSIS — E669 Obesity, unspecified: Secondary | ICD-10-CM | POA: Diagnosis not present

## 2020-05-14 DIAGNOSIS — Z789 Other specified health status: Secondary | ICD-10-CM | POA: Diagnosis not present

## 2020-05-14 DIAGNOSIS — U071 COVID-19: Secondary | ICD-10-CM | POA: Diagnosis not present

## 2020-05-14 DIAGNOSIS — Z299 Encounter for prophylactic measures, unspecified: Secondary | ICD-10-CM | POA: Diagnosis not present

## 2020-05-21 DIAGNOSIS — Z299 Encounter for prophylactic measures, unspecified: Secondary | ICD-10-CM | POA: Diagnosis not present

## 2020-05-21 DIAGNOSIS — I10 Essential (primary) hypertension: Secondary | ICD-10-CM | POA: Diagnosis not present

## 2020-05-21 DIAGNOSIS — Z789 Other specified health status: Secondary | ICD-10-CM | POA: Diagnosis not present

## 2020-05-21 DIAGNOSIS — U071 COVID-19: Secondary | ICD-10-CM | POA: Diagnosis not present

## 2020-06-01 DIAGNOSIS — L570 Actinic keratosis: Secondary | ICD-10-CM | POA: Diagnosis not present

## 2020-06-14 ENCOUNTER — Other Ambulatory Visit: Payer: Self-pay | Admitting: Obstetrics & Gynecology

## 2020-07-13 DIAGNOSIS — Z6832 Body mass index (BMI) 32.0-32.9, adult: Secondary | ICD-10-CM | POA: Diagnosis not present

## 2020-07-13 DIAGNOSIS — Z299 Encounter for prophylactic measures, unspecified: Secondary | ICD-10-CM | POA: Diagnosis not present

## 2020-07-13 DIAGNOSIS — Z0000 Encounter for general adult medical examination without abnormal findings: Secondary | ICD-10-CM | POA: Diagnosis not present

## 2020-07-13 DIAGNOSIS — E7800 Pure hypercholesterolemia, unspecified: Secondary | ICD-10-CM | POA: Diagnosis not present

## 2020-07-13 DIAGNOSIS — Z23 Encounter for immunization: Secondary | ICD-10-CM | POA: Diagnosis not present

## 2020-07-13 DIAGNOSIS — Z1331 Encounter for screening for depression: Secondary | ICD-10-CM | POA: Diagnosis not present

## 2020-07-13 DIAGNOSIS — I10 Essential (primary) hypertension: Secondary | ICD-10-CM | POA: Diagnosis not present

## 2020-07-13 DIAGNOSIS — Z79899 Other long term (current) drug therapy: Secondary | ICD-10-CM | POA: Diagnosis not present

## 2020-07-13 DIAGNOSIS — F419 Anxiety disorder, unspecified: Secondary | ICD-10-CM | POA: Diagnosis not present

## 2020-08-30 ENCOUNTER — Encounter: Payer: Self-pay | Admitting: Adult Health

## 2020-08-30 ENCOUNTER — Other Ambulatory Visit: Payer: Self-pay

## 2020-08-30 ENCOUNTER — Ambulatory Visit (INDEPENDENT_AMBULATORY_CARE_PROVIDER_SITE_OTHER): Payer: BC Managed Care – PPO | Admitting: Adult Health

## 2020-08-30 VITALS — BP 125/85 | HR 84 | Ht 67.0 in | Wt 210.0 lb

## 2020-08-30 DIAGNOSIS — Z01419 Encounter for gynecological examination (general) (routine) without abnormal findings: Secondary | ICD-10-CM | POA: Diagnosis not present

## 2020-08-30 DIAGNOSIS — Z1211 Encounter for screening for malignant neoplasm of colon: Secondary | ICD-10-CM | POA: Diagnosis not present

## 2020-08-30 DIAGNOSIS — N898 Other specified noninflammatory disorders of vagina: Secondary | ICD-10-CM

## 2020-08-30 DIAGNOSIS — N3946 Mixed incontinence: Secondary | ICD-10-CM

## 2020-08-30 DIAGNOSIS — F419 Anxiety disorder, unspecified: Secondary | ICD-10-CM | POA: Insufficient documentation

## 2020-08-30 DIAGNOSIS — B379 Candidiasis, unspecified: Secondary | ICD-10-CM

## 2020-08-30 DIAGNOSIS — N3281 Overactive bladder: Secondary | ICD-10-CM

## 2020-08-30 DIAGNOSIS — Z79890 Hormone replacement therapy (postmenopausal): Secondary | ICD-10-CM | POA: Diagnosis not present

## 2020-08-30 LAB — POCT WET PREP (WET MOUNT)

## 2020-08-30 LAB — HEMOCCULT GUIAC POC 1CARD (OFFICE): Fecal Occult Blood, POC: NEGATIVE

## 2020-08-30 MED ORDER — OXYBUTYNIN CHLORIDE ER 10 MG PO TB24: 10.0000 mg | ORAL_TABLET | Freq: Every day | ORAL | 3 refills | Status: DC

## 2020-08-30 MED ORDER — BUSPIRONE HCL 5 MG PO TABS: 5.0000 mg | ORAL_TABLET | Freq: Two times a day (BID) | ORAL | 3 refills | Status: DC

## 2020-08-30 MED ORDER — FLUCONAZOLE 150 MG PO TABS: ORAL_TABLET | ORAL | 1 refills | Status: DC

## 2020-08-30 NOTE — Progress Notes (Signed)
Patient ID: Rebecca Williams, female   DOB: November 14, 1960, 60 y.o.   MRN: [id number] History of Present Illness:  Rebecca Williams is a 60 year old white female, married, PM in for a well woman gyn exam, she had a normal pap with negative HRHPV 05/27/2019. She had COVID in October.  Current Medications, Allergies, Past Medical History, Past Surgical History, Family History and Social History were reviewed in [employer] record.     Review of Systems: Patient denies any headaches, hearing loss, fatigue, blurred vision, shortness of breath, chest pain, abdominal pain, problems with bowel movements, or intercourse. No joint pain or mood swings. Has to pee every 2 hours and has stress and urge incontinence at times Has vaginal irritation Has hot flashes still Denies any vaginal bleeding +axiety, son at college     Physical Exam:BP 125/85 (BP Location: Left Arm, Patient Position: Sitting, Cuff Size: Large)   Pulse 84   Ht 5\' 7"  (1.702 m)   Wt 210 lb (95.3 kg)   BMI 32.89 kg/m  General:  Well developed, well nourished, no acute distress Skin:  Warm and dry Neck:  Midline trachea, normal thyroid, good ROM, no lymphadenopathy Lungs; Clear to auscultation bilaterally Breast:  No dominant palpable mass, retraction, or nipple discharge Cardiovascular: Regular rate and rhythm Abdomen:  Soft, non tender, no hepatosplenomegaly Pelvic:  External genitalia is normal in appearance, has several epidermal cysts on both labia.  The vagina has red side walls and white discharge, wet prep: +yeast. Urethra has no lesions or masses. The cervix is bulbous.  Uterus is felt to be normal size, shape, and contour.  No adnexal masses or tenderness noted.Bladder is non tender, no masses felt. Rectal: Good sphincter tone, no polyps, or hemorrhoids felt.  Hemoccult negative. Extremities/musculoskeletal:  No swelling or varicosities noted, no clubbing or cyanosis Psych:  No mood changes, alert and  cooperative,seems happy AA is 8 Fall risk is low PHQ 9 score is 3 GAD 7 score is 12, will rx buspar  [facility] - 08/30/20 0929      Pregnancy Intention Screening   Does the patient want to become pregnant in the next year? N/A    Does the patient's partner want to become pregnant in the next year? N/A    Would the patient like to discuss contraceptive options today? N/A      Contraception Wrap Up   Current Method No Method - Other Reason   ablation   End Method No Method - Other Reason   ablation   Contraception Counseling Provided No         Examination chaperoned by Levy Pupa LPN Reviewed labs with her that she had at PCP   Impression and Plan: 1. Encounter for well woman exam with routine gynecological exam Pap in 2023 Physical in 1 year Get mammogram  Labs with PCP    2. Encounter for screening fecal occult blood testing  3. Hormone replacement therapy (HRT) Continue divigel,estrace and provera, has refills   4. Vaginal irritation   5. OAB (overactive bladder) Will rx oxybutynin.call with me with up date    6. Mixed stress and urge urinary incontinence  7. Yeast infection Will rx diflucan   8. Anxiety Will rx buspar   Meds ordered this encounter  Medications  . fluconazole (DIFLUCAN) 150 MG tablet    Sig: Take 1 now and 1 in 3 days    Dispense:  2 tablet    Refill:  1  Order Specific Question:   Supervising Provider    Answer:   Tania Ade H [[id number]]  . oxybutynin (DITROPAN-XL) 10 MG 24 hr tablet    Sig: Take 1 tablet (10 mg total) by mouth at bedtime.    Dispense:  30 tablet    Refill:  3    Order Specific Question:   Supervising Provider    Answer:   Elonda Husky, LUTHER H [[id number]]  . busPIRone (BUSPAR) 5 MG tablet    Sig: Take 1 tablet (5 mg total) by mouth 2 (two) times daily.    Dispense:  60 tablet    Refill:  3    Order Specific Question:   Supervising Provider    Answer:   Tania Ade H [[id number]]

## 2020-09-02 ENCOUNTER — Telehealth: Payer: Self-pay

## 2020-09-02 NOTE — Telephone Encounter (Signed)
Pt got notification that she had some results to view. Pt don't do MyChart. I advised her hemocult card was negative and the wet prep that was done in office was positive for yeast. Pt aware and voiced understanding. [place]

## 2020-09-02 NOTE — Telephone Encounter (Signed)
Patient called wants results of recent labs. She said she doesn't do mychart. Please call back. [phone]

## 2020-10-12 DIAGNOSIS — Z299 Encounter for prophylactic measures, unspecified: Secondary | ICD-10-CM | POA: Diagnosis not present

## 2020-10-12 DIAGNOSIS — Z789 Other specified health status: Secondary | ICD-10-CM | POA: Diagnosis not present

## 2020-10-12 DIAGNOSIS — I10 Essential (primary) hypertension: Secondary | ICD-10-CM | POA: Diagnosis not present

## 2020-10-12 DIAGNOSIS — R202 Paresthesia of skin: Secondary | ICD-10-CM | POA: Diagnosis not present

## 2020-10-12 DIAGNOSIS — F419 Anxiety disorder, unspecified: Secondary | ICD-10-CM | POA: Diagnosis not present

## 2020-11-29 DIAGNOSIS — L570 Actinic keratosis: Secondary | ICD-10-CM | POA: Diagnosis not present

## 2021-01-12 ENCOUNTER — Other Ambulatory Visit: Payer: Self-pay | Admitting: Obstetrics & Gynecology

## 2021-01-19 DIAGNOSIS — Z6832 Body mass index (BMI) 32.0-32.9, adult: Secondary | ICD-10-CM | POA: Diagnosis not present

## 2021-01-19 DIAGNOSIS — Z299 Encounter for prophylactic measures, unspecified: Secondary | ICD-10-CM | POA: Diagnosis not present

## 2021-01-19 DIAGNOSIS — I10 Essential (primary) hypertension: Secondary | ICD-10-CM | POA: Diagnosis not present

## 2021-01-19 DIAGNOSIS — Z713 Dietary counseling and surveillance: Secondary | ICD-10-CM | POA: Diagnosis not present

## 2021-04-02 IMAGING — MG DIGITAL SCREENING BILATERAL MAMMOGRAM WITH TOMO AND CAD
8 series · 8 of 24 positions shown · non-contrast
Comparison: Previous exam(s).

CLINICAL DATA: Screening.

EXAM:
DIGITAL SCREENING BILATERAL MAMMOGRAM WITH TOMO AND CAD

[L CC synth-2D]
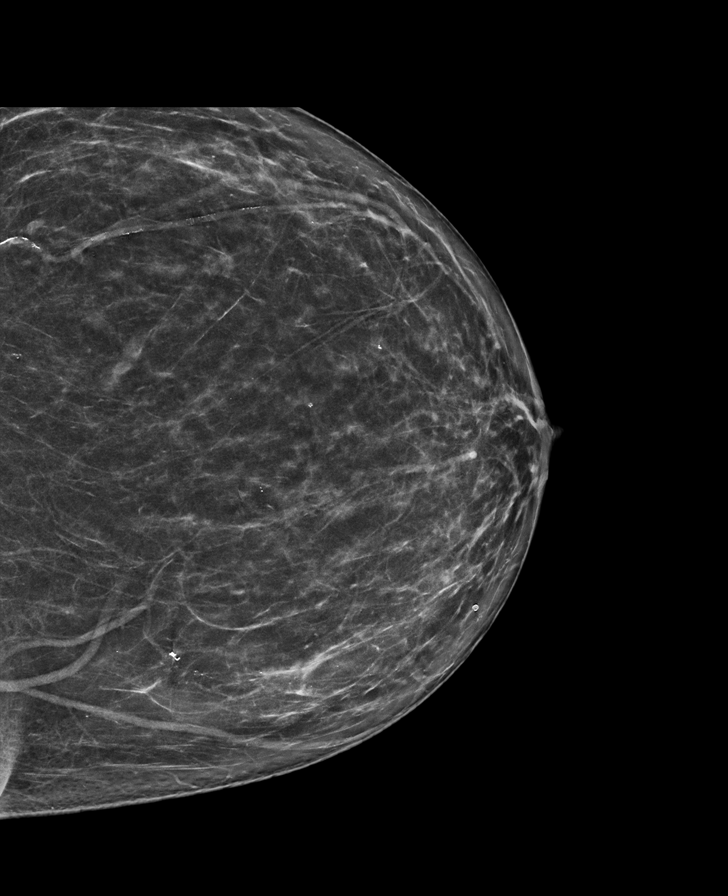

[R MLO synth-2D]
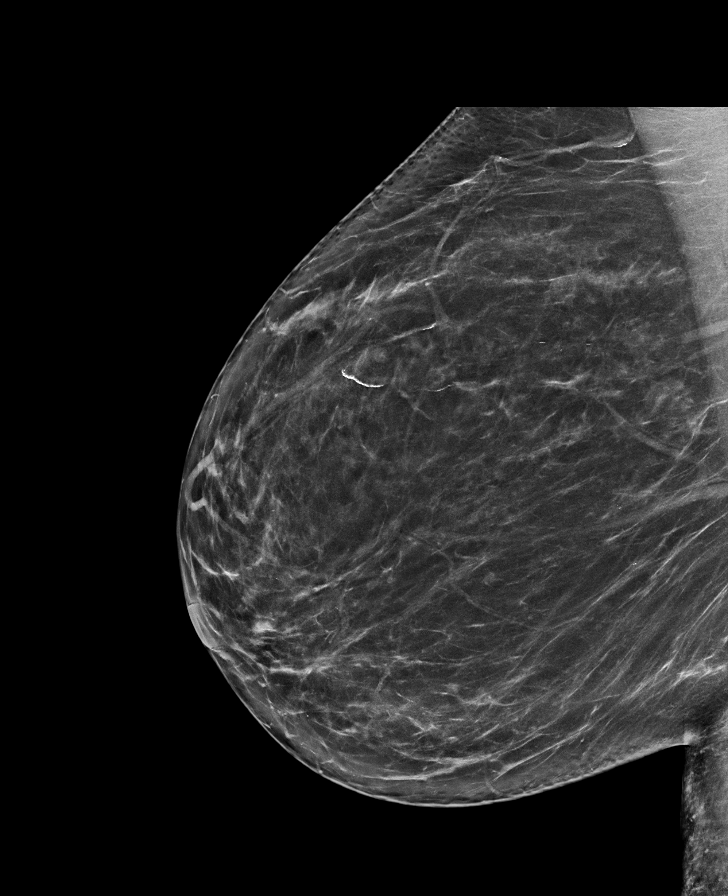

[R CC synth-2D]
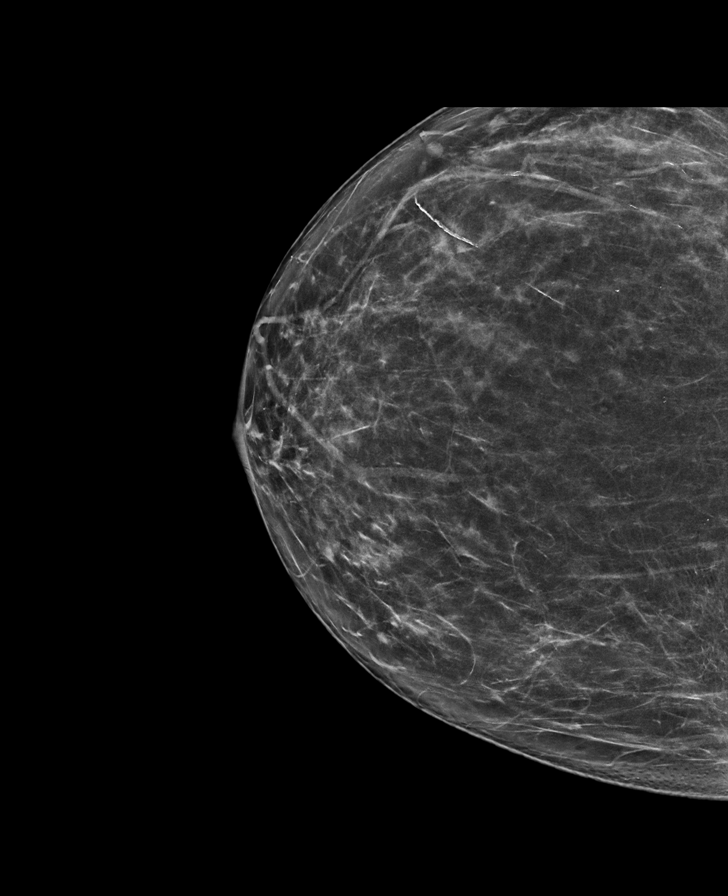

[L MLO synth-2D]
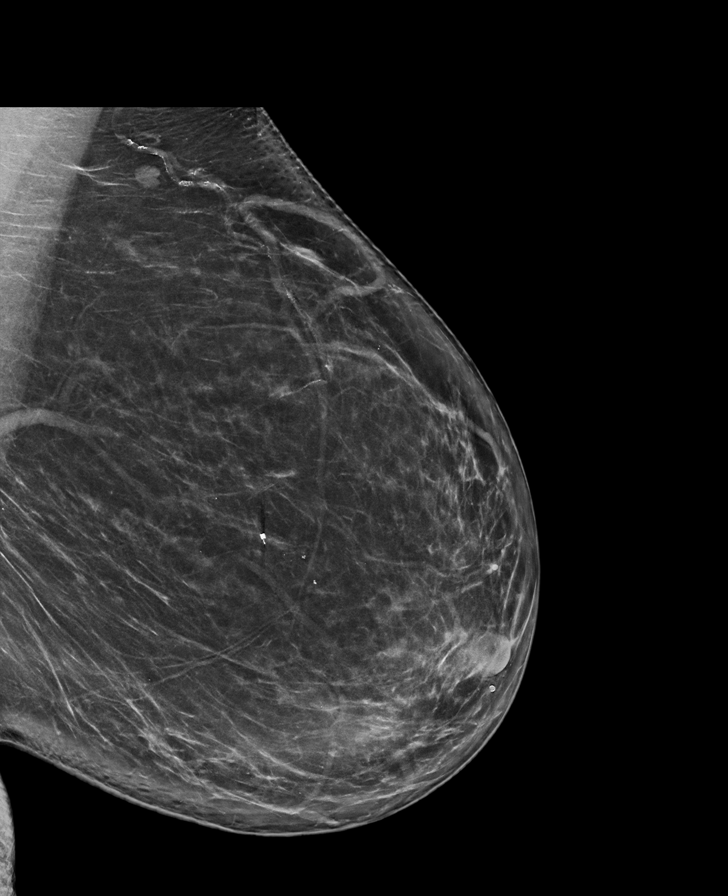

[L CC tomo · tomo slice 39/77.0]
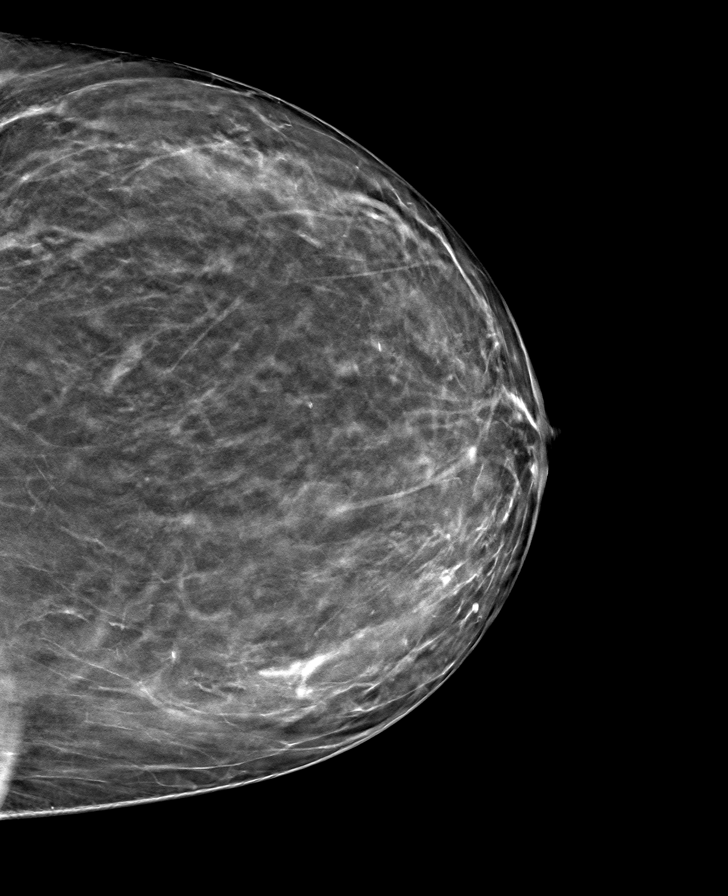

[L MLO tomo · tomo slice 45/89.0]
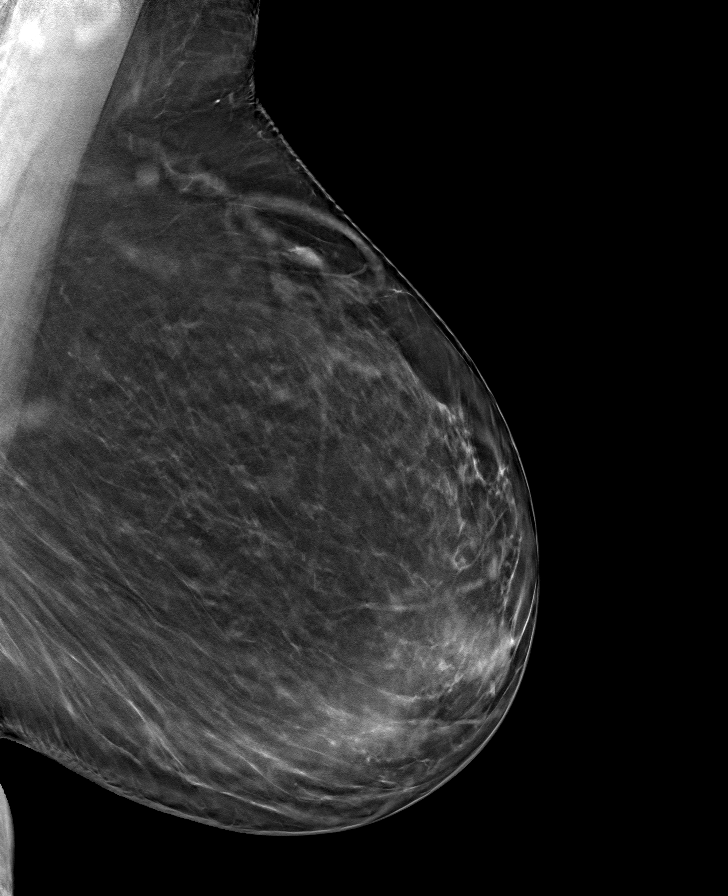

[R CC tomo · tomo slice 39/78.0]
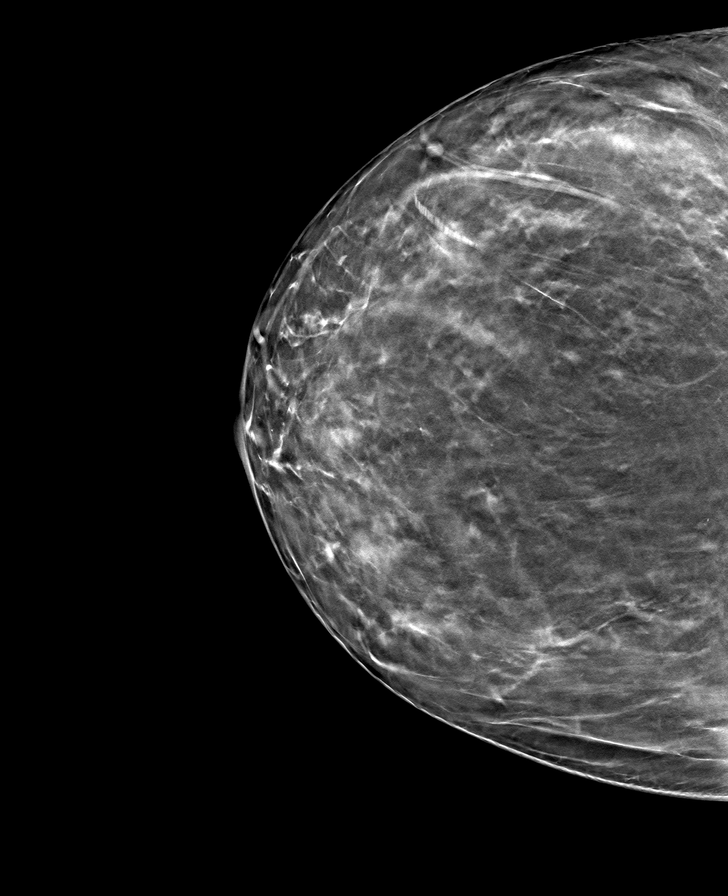

[R MLO tomo · tomo slice 43/86.0]
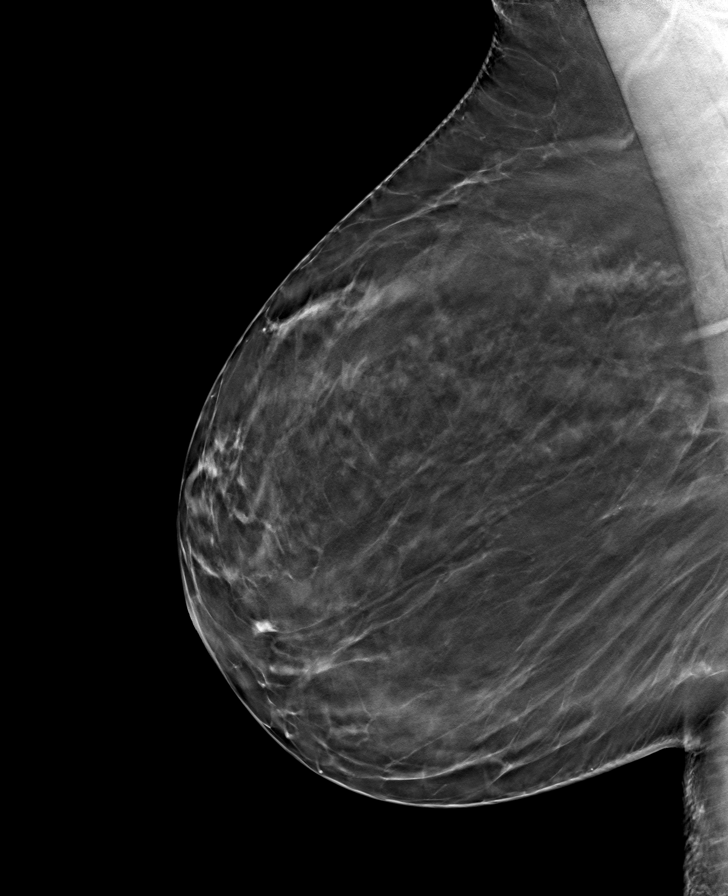

[8 of 24 positions shown; findings below may reference images not displayed]

ACR Breast Density Category b: There are scattered areas of
fibroglandular density.
FINDINGS: There are no findings suspicious for malignancy. Images were
processed with CAD.
IMPRESSION: No mammographic evidence of malignancy. A result letter of this
screening mammogram will be mailed directly to the patient.

RECOMMENDATION:
Screening mammogram in one year. (Code:CN-U-775)

BI-RADS CATEGORY  1: Negative.

## 2021-05-05 ENCOUNTER — Other Ambulatory Visit: Payer: Self-pay | Admitting: Obstetrics & Gynecology

## 2021-06-07 ENCOUNTER — Other Ambulatory Visit: Payer: Self-pay | Admitting: Adult Health

## 2021-06-22 DIAGNOSIS — Z299 Encounter for prophylactic measures, unspecified: Secondary | ICD-10-CM | POA: Diagnosis not present

## 2021-06-22 DIAGNOSIS — I10 Essential (primary) hypertension: Secondary | ICD-10-CM | POA: Diagnosis not present

## 2021-06-22 DIAGNOSIS — M461 Sacroiliitis, not elsewhere classified: Secondary | ICD-10-CM | POA: Diagnosis not present

## 2021-06-22 DIAGNOSIS — R21 Rash and other nonspecific skin eruption: Secondary | ICD-10-CM | POA: Diagnosis not present

## 2021-06-22 DIAGNOSIS — F419 Anxiety disorder, unspecified: Secondary | ICD-10-CM | POA: Diagnosis not present

## 2021-07-20 DIAGNOSIS — E7800 Pure hypercholesterolemia, unspecified: Secondary | ICD-10-CM | POA: Diagnosis not present

## 2021-07-20 DIAGNOSIS — Z299 Encounter for prophylactic measures, unspecified: Secondary | ICD-10-CM | POA: Diagnosis not present

## 2021-07-20 DIAGNOSIS — Z6833 Body mass index (BMI) 33.0-33.9, adult: Secondary | ICD-10-CM | POA: Diagnosis not present

## 2021-07-20 DIAGNOSIS — Z1331 Encounter for screening for depression: Secondary | ICD-10-CM | POA: Diagnosis not present

## 2021-07-20 DIAGNOSIS — I10 Essential (primary) hypertension: Secondary | ICD-10-CM | POA: Diagnosis not present

## 2021-07-20 DIAGNOSIS — F419 Anxiety disorder, unspecified: Secondary | ICD-10-CM | POA: Diagnosis not present

## 2021-07-20 DIAGNOSIS — Z79899 Other long term (current) drug therapy: Secondary | ICD-10-CM | POA: Diagnosis not present

## 2021-07-20 DIAGNOSIS — Z789 Other specified health status: Secondary | ICD-10-CM | POA: Diagnosis not present

## 2021-07-20 DIAGNOSIS — Z0000 Encounter for general adult medical examination without abnormal findings: Secondary | ICD-10-CM | POA: Diagnosis not present

## 2021-07-25 ENCOUNTER — Other Ambulatory Visit: Payer: Self-pay | Admitting: Adult Health

## 2021-07-25 DIAGNOSIS — Z1231 Encounter for screening mammogram for malignant neoplasm of breast: Secondary | ICD-10-CM

## 2021-08-31 ENCOUNTER — Ambulatory Visit: Payer: BC Managed Care – PPO

## 2021-09-19 ENCOUNTER — Encounter: Payer: Self-pay | Admitting: Adult Health

## 2021-09-19 ENCOUNTER — Other Ambulatory Visit: Payer: Self-pay

## 2021-09-19 ENCOUNTER — Other Ambulatory Visit (HOSPITAL_COMMUNITY)
Admission: RE | Admit: 2021-09-19 | Discharge: 2021-09-19 | Disposition: A | Discharge status: Home / Self Care | Payer: BC Managed Care – PPO | Source: Ambulatory Visit | Attending: Adult Health | Admitting: Adult Health

## 2021-09-19 ENCOUNTER — Ambulatory Visit (INDEPENDENT_AMBULATORY_CARE_PROVIDER_SITE_OTHER): Payer: BC Managed Care – PPO | Admitting: Adult Health

## 2021-09-19 VITALS — BP 124/72 | HR 66 | Ht 68.0 in | Wt 206.0 lb

## 2021-09-19 DIAGNOSIS — F419 Anxiety disorder, unspecified: Secondary | ICD-10-CM

## 2021-09-19 DIAGNOSIS — Z1211 Encounter for screening for malignant neoplasm of colon: Secondary | ICD-10-CM

## 2021-09-19 DIAGNOSIS — Z01419 Encounter for gynecological examination (general) (routine) without abnormal findings: Secondary | ICD-10-CM

## 2021-09-19 DIAGNOSIS — Z79890 Hormone replacement therapy (postmenopausal): Secondary | ICD-10-CM

## 2021-09-19 LAB — HEMOCCULT GUIAC POC 1CARD (OFFICE): Fecal Occult Blood, POC: NEGATIVE

## 2021-09-19 MED ORDER — ESTRADIOL 1 MG PO TABS: ORAL_TABLET | ORAL | 4 refills | Status: DC

## 2021-09-19 MED ORDER — MEDROXYPROGESTERONE ACETATE 2.5 MG PO TABS: 2.5000 mg | ORAL_TABLET | Freq: Every day | ORAL | 4 refills | Status: DC

## 2021-09-19 NOTE — Addendum Note (Signed)
Addended by: Octaviano Glow on: 09/19/2021 12:28 PM   Modules accepted: Orders

## 2021-09-19 NOTE — Progress Notes (Signed)
Patient ID: Rebecca Williams, female   DOB: [birth year], 61 y.o.   MRN: [id number] History of Present Illness: Rebecca Williams is a 61 year old white female,married, PM in for a well woman gyn exam and pap. She says feels great, has lost 13 lbs and is eating better.  PCP is Dr Woody Seller.   Current Medications, Allergies, Past Medical History, Past Surgical History, Family History and Social History were reviewed in [employer] record.     Review of Systems: Patient denies any headaches, hearing loss, fatigue, blurred vision, shortness of breath, chest pain, abdominal pain, problems with bowel movements, urination, or intercourse. No joint pain or mood swings.     Physical Exam:BP 124/72 (BP Location: Right Arm, Patient Position: Sitting, Cuff Size: Normal)    Pulse 66    Ht 5\' 8"  (1.727 m)    Wt 206 lb (93.4 kg)    BMI 31.32 kg/m   General:  Well developed, well nourished, no acute distress Skin:  Warm and dry Neck:  Midline trachea, normal thyroid, good ROM, no lymphadenopathy Lungs; Clear to auscultation bilaterally Breast:  No dominant palpable mass, retraction, or nipple discharge Cardiovascular: Regular rate and rhythm Abdomen:  Soft, non tender, no hepatosplenomegaly Pelvic:  External genitalia is normal in appearance, no lesions.  The vagina is normal in appearance. Urethra has no lesions or masses. The cervix is smooth, with stenotic os, pap with HR HPV genotyping performed.Marland Kitchen  Uterus is felt to be normal size, shape, and contour.  No adnexal masses or tenderness noted.Bladder is non tender, no masses felt. Rectal: Good sphincter tone, no polyps, or hemorrhoids felt.  Hemoccult negative. Extremities/musculoskeletal:  No swelling or varicosities noted, no clubbing or cyanosis Psych:  No mood changes, alert and cooperative,seems happy AA is 1 Fall risk is low Depression screen [facility] 2/9 09/19/2021 08/30/2020 05/27/2019  Decreased Interest 0 0 0  Down, Depressed,  Hopeless 0 0 0  PHQ - 2 Score 0 0 0  Altered sleeping 1 2 -  Tired, decreased energy 0 1 -  Change in appetite 0 0 -  Feeling bad or failure about yourself  0 0 -  Trouble concentrating 0 0 -  Moving slowly or fidgety/restless 0 0 -  Suicidal thoughts 0 0 -  PHQ-9 Score 1 3 -    GAD 7 : Generalized Anxiety Score 09/19/2021 08/30/2020  Nervous, Anxious, on Edge 0 1  Control/stop worrying 0 3  Worry too much - different things 1 3  Trouble relaxing 2 3  Restless 0 1  Easily annoyed or irritable 0 1  Afraid - awful might happen 0 0  Total GAD 7 Score 3 12  On Buspar prn  [facility] - 09/19/21 1042       Pregnancy Intention Screening   Does the patient want to become pregnant in the next year? No    Does the patient's partner want to become pregnant in the next year? No    Would the patient like to discuss contraceptive options today? No      Contraception Wrap Up   Current Method No Method - Other Reason   ablation and PM   End Method No Method - Other Reason   ablation   Contraception Counseling Provided No            Examination chaperoned by Tish RN     Impression and Plan: 1. Encounter for gynecological examination with Papanicolaou smear of cervix Pap sent Physical in  1 year Pap in 3 if normal Labs with PCP Mammogram 09/28/21 Colonoscopy per GI   2. Encounter for screening fecal occult blood testing   3. Hormone replacement therapy (HRT) Continue estrace and provera Meds ordered this encounter  Medications   estradiol (ESTRACE) 1 MG tablet    Sig: Take 1 daily    Dispense:  90 tablet    Refill:  4    This prescription was filled on 01/12/2021. Any refills authorized will be placed on file.    Order Specific Question:   Supervising Provider    Answer:   Florian Buff [[id number]]   medroxyPROGESTERone (PROVERA) 2.5 MG tablet    Sig: Take 1 tablet (2.5 mg total) by mouth daily.    Dispense:  90 tablet    Refill:  4    Order Specific Question:   Supervising  Provider    Answer:   Elonda Husky, LUTHER H [[id number]]     4. Anxiety Has refills on Buspar

## 2021-09-20 LAB — CYTOLOGY - PAP
Adequacy: ABSENT
Comment: NEGATIVE
Diagnosis: NEGATIVE
High risk HPV: NEGATIVE

## 2021-09-28 ENCOUNTER — Ambulatory Visit
Admission: RE | Admit: 2021-09-28 | Discharge: 2021-09-28 | Disposition: A | Discharge status: Home / Self Care | Payer: BC Managed Care – PPO | Source: Ambulatory Visit | Attending: Adult Health | Admitting: Adult Health

## 2021-09-28 DIAGNOSIS — Z1231 Encounter for screening mammogram for malignant neoplasm of breast: Secondary | ICD-10-CM

## 2021-09-29 ENCOUNTER — Other Ambulatory Visit: Payer: Self-pay | Admitting: Adult Health

## 2021-09-29 DIAGNOSIS — R928 Other abnormal and inconclusive findings on diagnostic imaging of breast: Secondary | ICD-10-CM

## 2021-10-13 ENCOUNTER — Other Ambulatory Visit: Payer: Self-pay | Admitting: Adult Health

## 2021-10-13 ENCOUNTER — Ambulatory Visit
Admission: RE | Admit: 2021-10-13 | Discharge: 2021-10-13 | Disposition: A | Discharge status: Home / Self Care | Payer: BC Managed Care – PPO | Source: Ambulatory Visit | Attending: Adult Health | Admitting: Adult Health

## 2021-10-13 DIAGNOSIS — R928 Other abnormal and inconclusive findings on diagnostic imaging of breast: Secondary | ICD-10-CM

## 2021-10-13 DIAGNOSIS — N6310 Unspecified lump in the right breast, unspecified quadrant: Secondary | ICD-10-CM

## 2021-10-21 ENCOUNTER — Ambulatory Visit
Admission: RE | Admit: 2021-10-21 | Discharge: 2021-10-21 | Disposition: A | Discharge status: Home / Self Care | Payer: BC Managed Care – PPO | Source: Ambulatory Visit | Attending: Adult Health | Admitting: Adult Health

## 2021-10-21 ENCOUNTER — Other Ambulatory Visit (HOSPITAL_COMMUNITY): Payer: Self-pay | Admitting: Diagnostic Radiology

## 2021-10-21 ENCOUNTER — Other Ambulatory Visit: Payer: Self-pay | Admitting: Adult Health

## 2021-10-21 DIAGNOSIS — N6310 Unspecified lump in the right breast, unspecified quadrant: Secondary | ICD-10-CM

## 2021-10-21 DIAGNOSIS — R921 Mammographic calcification found on diagnostic imaging of breast: Secondary | ICD-10-CM

## 2021-10-21 HISTORY — PX: BREAST BIOPSY: SHX20

## 2021-11-03 ENCOUNTER — Ambulatory Visit
Admission: RE | Admit: 2021-11-03 | Discharge: 2021-11-03 | Disposition: A | Discharge status: Home / Self Care | Payer: BC Managed Care – PPO | Source: Ambulatory Visit | Attending: Adult Health | Admitting: Adult Health

## 2021-11-03 DIAGNOSIS — N6310 Unspecified lump in the right breast, unspecified quadrant: Secondary | ICD-10-CM

## 2021-11-03 DIAGNOSIS — R921 Mammographic calcification found on diagnostic imaging of breast: Secondary | ICD-10-CM

## 2022-03-30 ENCOUNTER — Other Ambulatory Visit: Payer: Self-pay | Admitting: Adult Health

## 2022-03-30 DIAGNOSIS — R921 Mammographic calcification found on diagnostic imaging of breast: Secondary | ICD-10-CM

## 2022-05-03 ENCOUNTER — Other Ambulatory Visit: Payer: Self-pay | Admitting: Obstetrics & Gynecology

## 2022-05-10 ENCOUNTER — Ambulatory Visit
Admission: RE | Admit: 2022-05-10 | Discharge: 2022-05-10 | Disposition: A | Discharge status: Home / Self Care | Payer: BC Managed Care – PPO | Source: Ambulatory Visit | Attending: Adult Health | Admitting: Adult Health

## 2022-05-10 ENCOUNTER — Other Ambulatory Visit: Payer: Self-pay | Admitting: Adult Health

## 2022-05-10 DIAGNOSIS — R921 Mammographic calcification found on diagnostic imaging of breast: Secondary | ICD-10-CM

## 2022-09-20 ENCOUNTER — Other Ambulatory Visit: Payer: Self-pay | Admitting: Adult Health

## 2022-10-04 ENCOUNTER — Other Ambulatory Visit: Payer: Self-pay | Admitting: Adult Health

## 2022-11-14 ENCOUNTER — Ambulatory Visit
Admission: RE | Admit: 2022-11-14 | Discharge: 2022-11-14 | Disposition: A | Discharge status: Home / Self Care | Payer: BC Managed Care – PPO | Source: Ambulatory Visit | Attending: Adult Health | Admitting: Adult Health

## 2022-11-14 DIAGNOSIS — R921 Mammographic calcification found on diagnostic imaging of breast: Secondary | ICD-10-CM

## 2023-01-25 ENCOUNTER — Encounter: Payer: Self-pay | Admitting: Adult Health

## 2023-01-25 ENCOUNTER — Ambulatory Visit (INDEPENDENT_AMBULATORY_CARE_PROVIDER_SITE_OTHER): Payer: BC Managed Care – PPO | Admitting: Adult Health

## 2023-01-25 VITALS — BP 142/84 | HR 61 | Ht 68.0 in | Wt 214.0 lb

## 2023-01-25 DIAGNOSIS — Z01419 Encounter for gynecological examination (general) (routine) without abnormal findings: Secondary | ICD-10-CM

## 2023-01-25 DIAGNOSIS — Z79890 Hormone replacement therapy (postmenopausal): Secondary | ICD-10-CM | POA: Diagnosis not present

## 2023-01-25 DIAGNOSIS — Z1211 Encounter for screening for malignant neoplasm of colon: Secondary | ICD-10-CM

## 2023-01-25 LAB — HEMOCCULT GUIAC POC 1CARD (OFFICE): Fecal Occult Blood, POC: NEGATIVE

## 2023-01-25 MED ORDER — ESTRADIOL 1 MG/GM TD GEL: TRANSDERMAL | 11 refills | Status: DC

## 2023-01-25 MED ORDER — MEDROXYPROGESTERONE ACETATE 2.5 MG PO TABS: 2.5000 mg | ORAL_TABLET | Freq: Every day | ORAL | 4 refills | Status: DC

## 2023-01-25 MED ORDER — ESTRADIOL 1 MG PO TABS: ORAL_TABLET | ORAL | 4 refills | Status: DC

## 2023-01-25 NOTE — Progress Notes (Signed)
Patient ID: Rebecca Williams, female   DOB: 15-May-1961, 62 y.o.   MRN: [id number] History of Present Illness: Rebecca Williams is a 62 year old white female, married, PM on HRT, in for a well woman gyn exam.  Last pap was negative HPV,NILM 09/19/21.  PCP is Dr Sherril Croon.   Current Medications, Allergies, Past Medical History, Past Surgical History, Family History and Social History were reviewed in [employer] record.     Review of Systems: Patient denies any headaches, hearing loss, fatigue, blurred vision, shortness of breath, chest pain, abdominal pain, problems with bowel movements, urination(occasional urge UI), or intercourse. No joint pain or mood swings.  Denies any vaginal bleeding Has had allergies Has gained some weight   Physical Exam:BP (!) 142/84 (BP Location: Right Arm, Patient Position: Sitting, Cuff Size: Normal)   Pulse 61   Ht 5\' 8"  (1.727 m)   Wt 214 lb (97.1 kg)   BMI 32.54 kg/m   General:  Well developed, well nourished, no acute distress Skin:  Warm and dry Neck:  Midline trachea, normal thyroid, good ROM, no lymphadenopathy,no carotid bruits herad Lungs; Clear to auscultation bilaterally Breast:  No dominant palpable mass, retraction, or nipple discharge Cardiovascular: Regular rate and rhythm Abdomen:  Soft, non tender, no hepatosplenomegaly Pelvic:  External genitalia is normal in appearance, no lesions.  The vagina is pale. Urethra has no lesions or masses. The cervix is smooth.  Uterus is felt to be normal size, shape, and contour.  No adnexal masses or tenderness noted.Bladder is non tender, no masses felt. Rectal: Good sphincter tone, no polyps, or hemorrhoids felt.  Hemoccult negative. Extremities/musculoskeletal:  No swelling or varicosities noted, no clubbing or cyanosis Psych:  No mood changes, alert and cooperative,seems happy AA is 1 Fall risk is low    01/25/2023    8:30 AM 09/19/2021   10:50 AM 08/30/2020    9:28 AM   Depression screen PHQ 2/9  Decreased Interest 0 0 0  Down, Depressed, Hopeless 0 0 0  PHQ - 2 Score 0 0 0  Altered sleeping 1 1 2   Tired, decreased energy 1 0 1  Change in appetite 0 0 0  Feeling bad or failure about yourself  0 0 0  Trouble concentrating 0 0 0  Moving slowly or fidgety/restless 0 0 0  Suicidal thoughts 0 0 0  PHQ-9 Score 2 1 3        01/25/2023    8:30 AM 09/19/2021   10:50 AM 08/30/2020    9:29 AM  GAD 7 : Generalized Anxiety Score  Nervous, Anxious, on Edge 0 0 1  Control/stop worrying 1 0 3  Worry too much - different things 1 1 3   Trouble relaxing 1 2 3   Restless 0 0 1  Easily annoyed or irritable 0 0 1  Afraid - awful might happen 0 0 0  Total GAD 7 Score 3 3 12       Upstream - 01/25/23 0841       Pregnancy Intention Screening   Does the patient want to become pregnant in the next year? N/A    Does the patient's partner want to become pregnant in the next year? N/A    Would the patient like to discuss contraceptive options today? N/A      Contraception Wrap Up   Current Method No Method - Other Reason   postmenopausal   Reason for No Current Contraceptive Method at Intake ([facility] Only) Other  End Method No Method - Other Reason   postmenopausal   Contraception Counseling Provided No            Examination chaperoned by Malachy Mood LPN   Impression and Plan: 1. Encounter for well woman exam with routine gynecological exam Physical in 1 year Pap in 2026 Labs with PCP Colonoscopy per GI Mammogram was negative 11/14/22 Follow up PCP on BP  2. Encounter for screening fecal occult blood testing Hemoccult was negative  - POCT occult blood stool  3. Hormone replacement therapy (HRT) Happy with HRT Meds ordered this encounter  Medications   estradiol (ESTRACE) 1 MG tablet    Sig: TAKE ONE TABLET BY MOUTH ONCE DAILY    Dispense:  90 tablet    Refill:  4    Order Specific Question:   Supervising Provider    Answer:   Lazaro Arms [[id number]]    medroxyPROGESTERone (PROVERA) 2.5 MG tablet    Sig: Take 1 tablet (2.5 mg total) by mouth daily.    Dispense:  90 tablet    Refill:  4    Order Specific Question:   Supervising Provider    Answer:   Duane Lope H [[id number]]   Estradiol 1 MG/GM GEL    Sig: PLACE ONE PACKET ONTO THE SKIN DAILY    Dispense:  30 g    Refill:  11    This prescription was filled on 05/03/2022. Any refills authorized will be placed on file.    Order Specific Question:   Supervising Provider    Answer:   Lazaro Arms [[id number]]

## 2023-05-08 ENCOUNTER — Other Ambulatory Visit: Payer: Self-pay | Admitting: Obstetrics & Gynecology

## 2023-05-11 ENCOUNTER — Telehealth: Payer: Self-pay

## 2023-05-11 MED ORDER — ESTRADIOL 1 MG/GM TD GEL: TRANSDERMAL | 11 refills | Status: DC

## 2023-05-11 NOTE — Telephone Encounter (Signed)
Patient called and stated that she needs her rx for Estradiol 1 MG/GM GEL transferred to [facility] [place]. She has refills but it is going to [facility].

## 2023-05-11 NOTE — Telephone Encounter (Signed)
Refilled estrogen gel

## 2023-05-11 NOTE — Telephone Encounter (Signed)
Pt needs Estradiol gel prescription to go to [facility] in [place]. Will you send it in? Thanks! JSY

## 2023-05-31 ENCOUNTER — Ambulatory Visit: Payer: BC Managed Care – PPO | Admitting: Urology

## 2023-05-31 ENCOUNTER — Encounter: Payer: Self-pay | Admitting: Urology

## 2023-05-31 VITALS — BP 137/84 | HR 79 | Ht 68.0 in | Wt 214.0 lb

## 2023-05-31 DIAGNOSIS — R3915 Urgency of urination: Secondary | ICD-10-CM

## 2023-05-31 DIAGNOSIS — R93429 Abnormal radiologic findings on diagnostic imaging of unspecified kidney: Secondary | ICD-10-CM | POA: Diagnosis not present

## 2023-05-31 DIAGNOSIS — N3281 Overactive bladder: Secondary | ICD-10-CM

## 2023-05-31 DIAGNOSIS — R350 Frequency of micturition: Secondary | ICD-10-CM

## 2023-05-31 NOTE — Progress Notes (Signed)
Subjective: 1. Abnormal finding on diagnostic imaging of kidney   2. Urgency of urination   3. Urinary frequency      Consult requested by Elzie Rings MD  05/31/23: Rebecca Williams is a 62 yo female who is referred for evaluation of abnormal renal imaging on a CT scan in July when she was being evaluated for RUQ pain that was subsequently found to be from cholecystitis requiring cholecystectomy.  She was noted to have mild bilateral perinephric stranding without stones, renal masses or obstruction.  Her Cr was 0.75 on 02/21/23 at the time of her CT.  She hasn't had a UA.  She also has a diagnosis of MUI with OAB. She doesn't wear pads.  She doesn't consume significant coffee, tea or diet sodas.  She has not had a recent UTI and has no history of stones.  She has had no GU surgery.   She doesn't recall ever having pyelonephritis.  Her UA is clear.    ROS:  Review of Systems  Constitutional:  Positive for malaise/fatigue.  Gastrointestinal:  Positive for diarrhea and nausea.  Genitourinary:  Positive for frequency and urgency.  Musculoskeletal:  Positive for back pain.  Skin:  Positive for itching.  Neurological:  Positive for headaches.  All other systems reviewed and are negative.   Allergies  Allergen Reactions   Elemental Sulfur Nausea Only    Past Medical History:  Diagnosis Date   Allergy    Hypertension     Past Surgical History:  Procedure Laterality Date   BREAST BIOPSY Left    negative, no visible scar   BREAST BIOPSY Right 10/21/2021   CESAREAN SECTION     ENDOMETRIAL ABLATION      Social History   Socioeconomic History   Marital status: Married    Spouse name: Not on file   Number of children: Not on file   Years of education: Not on file   Highest education level: Not on file  Occupational History   Not on file  Tobacco Use   Smoking status: Never   Smokeless tobacco: Never  Vaping Use   Vaping status: Never Used  Substance and Sexual Activity    Alcohol use: No   Drug use: No   Sexual activity: Yes    Birth control/protection: Post-menopausal, Surgical    Comment: ablation  Other Topics Concern   Not on file  Social History Narrative   Not on file   Social Determinants of Health   Financial Resource Strain: Low Risk  (01/25/2023)   Overall Financial Resource Strain (CARDIA)    Difficulty of Paying Living Expenses: Not hard at all  Food Insecurity: No Food Insecurity (01/25/2023)   Hunger Vital Sign    Worried About Running Out of Food in the Last Year: Never true    Ran Out of Food in the Last Year: Never true  Transportation Needs: No Transportation Needs (01/25/2023)   PRAPARE - Administrator, Civil Service (Medical): No    Lack of Transportation (Non-Medical): No  Physical Activity: Inactive (01/25/2023)   Exercise Vital Sign    Days of Exercise per Week: 0 days    Minutes of Exercise per Session: 0 min  Stress: No Stress Concern Present (01/25/2023)   [employer] of Occupational Health - Occupational Stress Questionnaire    Feeling of Stress : Only a little  Social Connections: Socially Isolated (01/25/2023)   Social Connection and Isolation Panel [NHANES]    Frequency of Communication with  Friends and Family: Once a week    Frequency of Social Gatherings with Friends and Family: Once a week    Attends Religious Services: Never    Database administrator or Organizations: No    Attends Banker Meetings: Never    Marital Status: Married  Catering manager Violence: Not At Risk (01/25/2023)   Humiliation, Afraid, Rape, and Kick questionnaire    Fear of Current or Ex-Partner: No    Emotionally Abused: No    Physically Abused: No    Sexually Abused: No    Family History  Problem Relation Age of Onset   Diabetes Father    Hypertension Father    Hyperlipidemia Father    Diabetes Mother    Hypertension Mother    Hyperlipidemia Mother    Fibromyalgia Sister    Hypertension Sister      Anti-infectives: Anti-infectives (From admission, onward)    None       Current Outpatient Medications  Medication Sig Dispense Refill   amLODipine (NORVASC) 5 MG tablet Take by mouth.     Biotin 5000 MCG TABS Take by mouth daily.      cetirizine (ZYRTEC) 10 MG chewable tablet Chew 10 mg by mouth daily.     Cholecalciferol (VITAMIN D3 PO) Take by mouth daily.     clonazePAM (KLONOPIN) 0.5 MG tablet Take 0.5 mg by mouth daily.     estradiol (ESTRACE) 1 MG tablet TAKE ONE TABLET BY MOUTH ONCE DAILY 90 tablet 4   Estradiol 1 MG/GM GEL APPLY CONTENTS OF ONE PACKET TO THE SKIN DAILY 30 g 11   Estradiol 1 MG/GM GEL PLACE ONE PACKET ONTO THE SKIN DAILY 30 g 11   hydrochlorothiazide (MICROZIDE) 12.5 MG capsule Take 25 mg by mouth daily.      medroxyPROGESTERone (PROVERA) 2.5 MG tablet Take 1 tablet (2.5 mg total) by mouth daily. 90 tablet 4   Multiple Vitamins-Minerals (ZINC PO) Take by mouth daily. Plus Vit C and B6     UNABLE TO FIND B plus B12 complex liquid-1 dispenser daily     No current facility-administered medications for this visit.     Objective: Vital signs in last 24 hours: BP 137/84   Pulse 79   Ht 5\' 8"  (1.727 m)   Wt 214 lb (97.1 kg)   BMI 32.54 kg/m   Intake/Output from previous day: No intake/output data recorded. Intake/Output this shift: @IOTHISSHIFT @   Physical Exam Vitals reviewed.  Constitutional:      Appearance: Normal appearance.  Neurological:     Mental Status: She is alert.     Lab Results:  No results found for this or any previous visit (from the past 24 hour(s)).  BMET No results for input(s): "NA", "K", "CL", "CO2", "GLUCOSE", "BUN", "CREATININE", "CALCIUM" in the last 72 hours. PT/INR No results for input(s): "LABPROT", "INR" in the last 72 hours. ABG No results for input(s): "PHART", "HCO3" in the last 72 hours.  Invalid input(s): "PCO2", "PO2" UA is clear.  Lab results in Care Everywhere reviewed.  Studies/Results: No  results found. CT films and report from 02/21/23 reviewed and discussed with patient.   Assessment/Plan: Mild bilateral perinephric stranding.   She has normal renal function, a clear urine and no concerning symptoms and the stranding is minimal.  There is no need for further evaluation or f/u.  OAB.  She has frequency and urgency with rare incontinence.  I discussed avoiding dietary irritants and urge suppression techniques as well as  meds but she is content with her condition.   No orders of the defined types were placed in this encounter.    No orders of the defined types were placed in this encounter.    Return if symptoms worsen or fail to improve.    CC: Dr. Arlyn Dunning and Dr. Doreen Beam.      Rebecca Williams 06/01/2023

## 2023-05-31 NOTE — Progress Notes (Signed)
post void residual = 18ml

## 2023-08-21 ENCOUNTER — Encounter (INDEPENDENT_AMBULATORY_CARE_PROVIDER_SITE_OTHER): Payer: Self-pay | Admitting: *Deleted

## 2024-01-28 ENCOUNTER — Other Ambulatory Visit: Payer: Self-pay | Admitting: Adult Health

## 2024-02-18 ENCOUNTER — Encounter (INDEPENDENT_AMBULATORY_CARE_PROVIDER_SITE_OTHER): Payer: Self-pay | Admitting: *Deleted

## 2024-03-20 ENCOUNTER — Encounter (INDEPENDENT_AMBULATORY_CARE_PROVIDER_SITE_OTHER): Payer: Self-pay | Admitting: *Deleted

## 2024-04-18 ENCOUNTER — Other Ambulatory Visit: Payer: Self-pay | Admitting: Adult Health

## 2024-04-19 ENCOUNTER — Other Ambulatory Visit: Payer: Self-pay | Admitting: Adult Health

## 2024-07-24 ENCOUNTER — Other Ambulatory Visit: Payer: Self-pay | Admitting: Adult Health

## 2024-08-27 ENCOUNTER — Telehealth (INDEPENDENT_AMBULATORY_CARE_PROVIDER_SITE_OTHER): Payer: Self-pay

## 2024-08-27 NOTE — Telephone Encounter (Signed)
 Who is your primary care physician: Keavie Hairfield at [facility]  Reasons for the colonoscopy: screening, family history of colon cancer  Have you had a colonoscopy before?  Yes, 13 years ago  Do you have family history of colon cancer? Yes, grandmother and cousin  Previous colonoscopy with polyps removed? no  Do you have a history colorectal cancer?   no  Are you diabetic? If yes, Type 1 or Type 2?    no  Do you have a prosthetic or mechanical heart valve? no  Do you have a pacemaker/defibrillator?   no  Have you had endocarditis/atrial fibrillation? no  Have you had joint replacement within the last 12 months?  no  Do you tend to be constipated or have to use laxatives? no  Do you have any history of drugs or alcohol?  no  Do you use supplemental oxygen?  no  Have you had a stroke or heart attack within the last 6 months? no  Do you take weight loss medication?  no  For female patients: have you had a hysterectomy?  no                                     are you post menopausal?       no                                            do you still have your menstrual cycle? no      Do you take any blood-thinning medications such as: (aspirin, warfarin, Plavix, Aggrenox)  no  If yes we need the name, milligram, dosage and who is prescribing doctor   Current Outpatient Medications  Medication Sig Dispense Refill   Biotin 5000 MCG TABS Take by mouth daily.      cetirizine (ZYRTEC) 10 MG chewable tablet Chew 10 mg by mouth daily.     Cholecalciferol (VITAMIN D3 PO) Take by mouth daily.     clonazePAM (KLONOPIN) 0.5 MG tablet Take 0.5 mg by mouth daily.     estradiol  (ESTRACE ) 1 MG tablet take one tablet by mouth once daily 90 tablet 0   Estradiol  1 MG/GM GEL APPLY CONTENTS OF ONE PACKET TO THE SKIN DAILY 30 g 11   hydrochlorothiazide (MICROZIDE) 12.5 MG capsule Take 25 mg by mouth daily.      medroxyPROGESTERone  (PROVERA ) 2.5 MG tablet take 1 tablet (2.5  milligram total) by mouth daily. 90 tablet 0   Multiple Vitamins-Minerals (ZINC PO) Take by mouth daily. Plus Vit C and B6     UNABLE TO FIND B plus B12 complex liquid-1 dispenser daily     No current facility-administered medications for this visit.    Allergies[1]  Pharmacy: [facility]   Primary Insurance Name: [facility]  Best number where you can be reached: cell: [phone]    work: [phone]     [1]  Allergies Allergen Reactions   Elemental Sulfur Nausea Only

## 2024-08-27 NOTE — Telephone Encounter (Signed)
Ok to schedule.  Room :Any   Thanks,  Vista Lawman, MD Gastroenterology and Hepatology [facility]

## 2024-08-29 MED ORDER — PEG 3350-KCL-NA BICARB-NACL 420 G PO SOLR: 4000.0000 mL | Freq: Once | ORAL | 0 refills | Status: AC

## 2024-08-29 NOTE — Telephone Encounter (Signed)
 Spoke with pt. She has been scheduled for 2/18. Aware will send rx for prep to pharmacy and instructions to her in the mail  Checked carelon and no PA required.

## 2024-08-29 NOTE — Addendum Note (Signed)
 Addended by: JEANELL GRAEME RAMAN on: 08/29/2024 09:35 AM   Modules accepted: Orders

## 2024-09-03 ENCOUNTER — Encounter (INDEPENDENT_AMBULATORY_CARE_PROVIDER_SITE_OTHER): Payer: Self-pay | Admitting: *Deleted

## 2024-09-03 NOTE — Telephone Encounter (Signed)
 Referral completed, [facility] apt letter sent to PCP

## 2024-09-04 ENCOUNTER — Encounter: Payer: Self-pay | Admitting: Adult Health

## 2024-09-04 ENCOUNTER — Other Ambulatory Visit: Payer: Self-pay | Admitting: Adult Health

## 2024-09-04 ENCOUNTER — Other Ambulatory Visit (HOSPITAL_COMMUNITY)
Admission: RE | Admit: 2024-09-04 | Discharge: 2024-09-04 | Disposition: A | Discharge status: Home / Self Care | Source: Ambulatory Visit | Attending: Adult Health | Admitting: Adult Health

## 2024-09-04 ENCOUNTER — Ambulatory Visit: Admitting: Adult Health

## 2024-09-04 VITALS — BP 155/93 | HR 64 | Ht 68.0 in | Wt 199.0 lb

## 2024-09-04 DIAGNOSIS — Z01419 Encounter for gynecological examination (general) (routine) without abnormal findings: Secondary | ICD-10-CM | POA: Diagnosis present

## 2024-09-04 DIAGNOSIS — Z01411 Encounter for gynecological examination (general) (routine) with abnormal findings: Secondary | ICD-10-CM | POA: Diagnosis not present

## 2024-09-04 DIAGNOSIS — I10 Essential (primary) hypertension: Secondary | ICD-10-CM

## 2024-09-04 DIAGNOSIS — R928 Other abnormal and inconclusive findings on diagnostic imaging of breast: Secondary | ICD-10-CM

## 2024-09-04 DIAGNOSIS — N907 Vulvar cyst: Secondary | ICD-10-CM

## 2024-09-04 DIAGNOSIS — Z79890 Hormone replacement therapy (postmenopausal): Secondary | ICD-10-CM

## 2024-09-04 DIAGNOSIS — Z1331 Encounter for screening for depression: Secondary | ICD-10-CM | POA: Diagnosis not present

## 2024-09-04 DIAGNOSIS — Z1151 Encounter for screening for human papillomavirus (HPV): Secondary | ICD-10-CM | POA: Diagnosis not present

## 2024-09-04 DIAGNOSIS — R921 Mammographic calcification found on diagnostic imaging of breast: Secondary | ICD-10-CM

## 2024-09-04 MED ORDER — ESTRADIOL 1 MG PO TABS: 1.0000 mg | ORAL_TABLET | Freq: Every day | ORAL | 3 refills | Status: AC

## 2024-09-04 MED ORDER — MEDROXYPROGESTERONE ACETATE 2.5 MG PO TABS: 2.5000 mg | ORAL_TABLET | Freq: Every day | ORAL | 3 refills | Status: AC

## 2024-09-04 NOTE — Progress Notes (Signed)
 Patient ID: Rebecca Williams Janet Antonetta, female   DOB: Oct 30, 1960, 64 y.o.   MRN: [id number] History of Present Illness: Rebecca Williams is a 64 year old white female, married, PM in for a well woman gyn exam and pap. She has seen dermatologist  recently, with some places removed.  PCP is Dr Rosamond   Current Medications, Allergies, Past Medical History, Past Surgical History, Family History and Social History were reviewed in [employer] electronic medical record.     Review of Systems: Patient denies any headaches, hearing loss, fatigue, blurred vision, shortness of breath, chest pain, abdominal pain, problems with bowel movements, urination, or intercourse. No joint pain or mood swings.  She denies any vaginal bleeding, she is happy with HRT    Physical Exam:BP (!) 155/93 (BP Location: Right Arm, Patient Position: Sitting, Cuff Size: Normal)   Pulse 64   Ht 5' 8 (1.727 m)   Wt 199 lb (90.3 kg)   BMI 30.26 kg/m   General:  Well developed, well nourished, no acute distress Skin:  Warm and dry Neck:  Midline trachea, normal thyroid, good ROM, no lymphadenopathy, no carotid bruits heard Lungs; Clear to auscultation bilaterally Breast:  No dominant palpable mass, retraction, or nipple discharge Cardiovascular: Regular rate and rhythm Abdomen:  Soft, non tender, no hepatosplenomegaly Pelvic:  External genitalia is normal in appearance, has sebaceous cyst on both labia.  The vagina is normal in appearance. Urethra has no lesions or masses. The cervix is smooth, with stenotic os, pap with HR HPV genotyping performed.  Uterus is felt to be normal size, shape, and contour.  No adnexal masses or tenderness noted.Bladder is non tender, no masses felt. Rectal: Deferred Extremities/musculoskeletal:  No swelling or varicosities noted, no clubbing or cyanosis Psych:  No mood changes, alert and cooperative,seems happy AA is 1 Fall risk is low    09/04/2024   11:40 AM 01/25/2023    8:30 AM 09/19/2021    10:50 AM  Depression screen PHQ 2/9  Decreased Interest 0 0 0  Down, Depressed, Hopeless 0 0 0  PHQ - 2 Score 0 0 0  Altered sleeping 1 1 1   Tired, decreased energy 1 1 0  Change in appetite 0 0 0  Feeling bad or failure about yourself  0 0 0  Trouble concentrating 0 0 0  Moving slowly or fidgety/restless 0 0 0  Suicidal thoughts 0 0 0  PHQ-9 Score 2 2  1       Data saved with a previous flowsheet row definition       09/04/2024   11:40 AM 01/25/2023    8:30 AM 09/19/2021   10:50 AM 08/30/2020    9:29 AM  GAD 7 : Generalized Anxiety Score  Nervous, Anxious, on Edge 0 0  0  1   Control/stop worrying 0 1  0  3   Worry too much - different things 0 1  1  3    Trouble relaxing 1 1  2  3    Restless 0 0  0  1   Easily annoyed or irritable 0 0  0  1   Afraid - awful might happen 0 0  0  0   Total GAD 7 Score 1 3 3 12      Data saved with a previous flowsheet row definition    Upstream - 09/04/24 1137       Pregnancy Intention Screening   Does the patient want to become pregnant in the next year? N/A  Does the patient's partner want to become pregnant in the next year? N/A    Would the patient like to discuss contraceptive options today? N/A      Contraception Wrap Up   Current Method Post-Menopause   ablation   End Method Post-Menopause   ablation   Contraception Counseling Provided No           Examination chaperoned by Clarita Salt LPN  Impression and plan: 1. Encounter for gynecological examination with Papanicolaou smear of cervix (Primary) Pap sent Pap in 3 years if negative  Physical in 1 year Labs with PCP - Cytology - PAP( [place])  2. Hormone replacement therapy (HRT) Happy with HRT Meds ordered this encounter  Medications   estradiol  (ESTRACE ) 1 MG tablet    Sig: Take 1 tablet (1 mg total) by mouth daily.    Dispense:  90 tablet    Refill:  3    NA    Supervising Provider:   JAYNE MINDER H [[id number]]   medroxyPROGESTERone  (PROVERA ) 2.5 MG tablet     Sig: Take 1 tablet (2.5 mg total) by mouth daily.    Dispense:  90 tablet    Refill:  3    NA    Supervising Provider:   JAYNE, LUTHER H [[id number]]     3. Abnormal mammogram of right breast Last mammogram was 2024  She will call [facility] for appt - US  LIMITED ULTRASOUND INCLUDING AXILLA RIGHT BREAST; Future - MM 3D DIAGNOSTIC MAMMOGRAM BILATERAL BREAST; Future  4. Sebaceous cyst of labia Leave alone    5. Hypertension Continue meds and follow up with PCP

## 2024-09-08 LAB — CYTOLOGY - PAP
Adequacy: ABSENT
Comment: NEGATIVE
Diagnosis: NEGATIVE
High risk HPV: NEGATIVE

## 2024-09-09 ENCOUNTER — Ambulatory Visit: Payer: Self-pay | Admitting: Adult Health

## 2024-09-24 ENCOUNTER — Ambulatory Visit (HOSPITAL_COMMUNITY): Admit: 2024-09-24 | Admitting: Gastroenterology

## 2024-09-24 ENCOUNTER — Encounter (HOSPITAL_COMMUNITY): Payer: Self-pay

## 2024-10-29 ENCOUNTER — Encounter
